# Patient Record
Sex: Female | Born: 1975 | Race: White | Hispanic: No | Marital: Married | Smoking: Never smoker
Health system: Southern US, Community
[De-identification: ages and names within clinical notes are randomized; demographics above are authoritative.]

## PROBLEM LIST (undated history)

## (undated) DIAGNOSIS — F419 Anxiety disorder, unspecified: Secondary | ICD-10-CM

## (undated) DIAGNOSIS — Z8619 Personal history of other infectious and parasitic diseases: Secondary | ICD-10-CM

## (undated) DIAGNOSIS — T7840XA Allergy, unspecified, initial encounter: Secondary | ICD-10-CM

## (undated) DIAGNOSIS — B2790 Infectious mononucleosis, unspecified without complication: Secondary | ICD-10-CM

## (undated) DIAGNOSIS — F509 Eating disorder, unspecified: Secondary | ICD-10-CM

## (undated) HISTORY — DX: Eating disorder, unspecified: F50.9

## (undated) HISTORY — DX: Infectious mononucleosis, unspecified without complication: B27.90

## (undated) HISTORY — DX: Personal history of other infectious and parasitic diseases: Z86.19

## (undated) HISTORY — DX: Anxiety disorder, unspecified: F41.9

## (undated) HISTORY — DX: Allergy, unspecified, initial encounter: T78.40XA

---

## 2007-06-27 DIAGNOSIS — B2790 Infectious mononucleosis, unspecified without complication: Secondary | ICD-10-CM

## 2007-06-27 HISTORY — DX: Infectious mononucleosis, unspecified without complication: B27.90

## 2015-12-26 LAB — HM PAP SMEAR: HM PAP: NORMAL

## 2016-05-15 LAB — HM MAMMOGRAPHY: HM Mammogram: NORMAL

## 2017-08-03 ENCOUNTER — Ambulatory Visit (INDEPENDENT_AMBULATORY_CARE_PROVIDER_SITE_OTHER): Payer: BC Managed Care – PPO | Admitting: Orthopaedic Surgery

## 2017-08-03 ENCOUNTER — Encounter (INDEPENDENT_AMBULATORY_CARE_PROVIDER_SITE_OTHER): Payer: Self-pay | Admitting: Orthopaedic Surgery

## 2017-08-03 ENCOUNTER — Ambulatory Visit (INDEPENDENT_AMBULATORY_CARE_PROVIDER_SITE_OTHER): Payer: Self-pay

## 2017-08-03 DIAGNOSIS — M25552 Pain in left hip: Secondary | ICD-10-CM | POA: Diagnosis not present

## 2017-08-03 DIAGNOSIS — M25551 Pain in right hip: Secondary | ICD-10-CM | POA: Insufficient documentation

## 2017-08-03 NOTE — Progress Notes (Signed)
Office Visit Note   Patient: Diana Perez           Date of Birth: 12-26-1975           MRN: [id number] Visit Date: 08/03/2017              Requested by: No referring provider defined for this encounter. PCP: Donato SchultzLowne Chase, Yvonne R, DO   Assessment & Plan: Visit Diagnoses:  1. Pain of both hip joints   2. 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  At this point I have proposed getting an MRI arthrogram of the right hip to assess the labrum.  There is a question of being able to afford this.  If she is unable to afford this we may go straight to a diagnostic therapeutic intra-articular cortisone injection under fluoroscopy by Dr. Alvester [facility]inNewton.  We will first try getting the MRI arthrogram.  We will wait to hear from the patient if she is not able to proceed with the scan.  Follow-Up Instructions: Return for after mri arthrogram.   Orders:  Orders Placed This Encounter  Procedures  . XR HIPS BILAT W OR W/O PELVIS 2V  . MR Hip Right w/ contrast  . DL FLUORO GUIDED NEEDLE PLC ASPIRATION / INJECTTION/LOC   No orders of the defined types were placed in this encounter.     Procedures: No procedures performed   Clinical Data: No additional findings.   Subjective: Chief Complaint  Patient presents with  . Right Hip - Pain    HPI this is a pleasant 42 year old female who presents to our clinic today with bilateral hip pain right greater than left.  This is been ongoing since 2007 when she was pregnant with her first child.  It started out as intermittent but has become more consistent over the past 4-5 weeks.  All of her pain was located to the groin.  No anterior thigh or lateral hip pain.  She describes as a sharp shooting in nature worse with sleeping as well as turning her hip a certain way.  She is been seen for this back in 2007 where she was sent to outpatient physical therapy as well as given ibuprofen.  Physical therapy did  not improve things and ibuprofen made it worse.  No radicular symptoms noted.  No previous MRI either hip.  Review of Systems as detailed in HPI.  All others reviewed and are negative.   Objective: Vital Signs: There were no vitals taken for this visit.  Physical Exam well-developed well-nourished female in no acute distress.  Alert and oriented x3.  Ortho Exam examination of both hips reveals negative logroll.  Negative straight leg raise.  Positive Faber positive Stinchfield.  Specialty Comments:  No specialty comments available.  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  No fracture.    PMFS History: Patient Active Problem List   Diagnosis Date Noted  . Pain of both hip joints 08/03/2017   Past Medical History:  Diagnosis Date  . Mononucleosis 06/2007    History reviewed. No pertinent family history.  Past Surgical History:  Procedure Laterality Date  . CESAREAN SECTION  05/08/2008  . CESAREAN SECTION  02/04/2006   Social History   Occupational History  . Not on file  Tobacco Use  . Smoking status: Never Smoker  . Smokeless tobacco:  Never Used  Substance and Sexual Activity  . Alcohol use: Not on file  . Drug use: Not on file  . Sexual activity: Not on file

## 2017-08-05 ENCOUNTER — Telehealth (INDEPENDENT_AMBULATORY_CARE_PROVIDER_SITE_OTHER): Payer: Self-pay | Admitting: Orthopaedic Surgery

## 2017-08-05 DIAGNOSIS — M25551 Pain in right hip: Secondary | ICD-10-CM

## 2017-08-05 DIAGNOSIS — M25552 Pain in left hip: Principal | ICD-10-CM

## 2017-08-05 NOTE — Telephone Encounter (Signed)
Yes please.  Let's do the injection first

## 2017-08-05 NOTE — Telephone Encounter (Signed)
Patient was told to get an MRI but with the cost being $1000 out of pocket she said another route Dr. Roda ShuttersXu said was to be a diagnostic cortisone injection. Is this to be scheduled with him or Dr. Alvester MorinNewton? Please advise patient what to do [phone]

## 2017-08-05 NOTE — Telephone Encounter (Signed)
See message below °

## 2017-08-06 NOTE — Telephone Encounter (Signed)
[facility] bilateral hips, R > L, [facility].  I closed referral for MRI at this time. IC pt and LMVM advised.

## 2017-08-12 ENCOUNTER — Ambulatory Visit (INDEPENDENT_AMBULATORY_CARE_PROVIDER_SITE_OTHER): Payer: BC Managed Care – PPO

## 2017-08-12 ENCOUNTER — Encounter (INDEPENDENT_AMBULATORY_CARE_PROVIDER_SITE_OTHER): Payer: Self-pay | Admitting: Physical Medicine and Rehabilitation

## 2017-08-12 ENCOUNTER — Ambulatory Visit (INDEPENDENT_AMBULATORY_CARE_PROVIDER_SITE_OTHER): Payer: BC Managed Care – PPO | Admitting: Physical Medicine and Rehabilitation

## 2017-08-12 DIAGNOSIS — M25551 Pain in right hip: Secondary | ICD-10-CM | POA: Diagnosis not present

## 2017-08-12 DIAGNOSIS — M25552 Pain in left hip: Secondary | ICD-10-CM

## 2017-08-12 NOTE — Progress Notes (Signed)
Diana Perez - 42 y.o. female MRN [id number]  Date of birth: 03-21-1976  Office Visit Note: Visit Date: 08/12/2017 PCP: Donato SchultzLowne Chase, Yvonne R, DO Referred by: Donato SchultzLowne Chase, Yvonne R, *  Subjective: Chief Complaint  Patient presents with  . Right Hip - Pain  . Left Hip - Pain   HPI: Diana Perez is a 42 year old female with chronic hip pain for over 12 years.  She was recently seen by Dr. Roda ShuttersXu who felt like she probably had labral tears and an MRI arthrogram was ordered for both hips.  Unfortunately because of financial situation she could not have that imaging study performed.  They did decide to complete diagnostic and hopefully therapeutic intra-articular hip injections.  She has both right and left hip pain and will do the right side today.    ROS Otherwise per HPI.  Assessment & Plan: Visit Diagnoses:  1. Pain in right hip   2. Pain in left hip     Plan: Findings:  Right intra-articular hip injection with fluoroscopic guidance.  Patient did seem to have relief during the anesthetic phase.  She is going to return for the left side in 2 weeks.    Meds & Orders: No orders of the defined types were placed in this encounter.   Orders Placed This Encounter  Procedures  . Large Joint Inj: R hip joint  . XR C-ARM NO REPORT    Follow-up: Return if symptoms worsen or fail to improve, for Dr. Roda ShuttersXu.   Procedures: Large Joint Inj: R hip joint on 08/12/2017 2:23 PM Indications: diagnostic evaluation and pain Details: 22 G 3.5 in needle, fluoroscopy-guided anterior approach  Arthrogram: No  Medications: 3 mL bupivacaine 0.5 %; 80 mg triamcinolone acetonide 40 MG/ML Outcome: tolerated well, no immediate complications  There was excellent flow of contrast producing a partial arthrogram of the hip. The patient did have relief of symptoms during the anesthetic phase of the injection. Procedure, treatment alternatives, risks and benefits explained, specific risks discussed. Consent  was given by the patient. Immediately prior to procedure a time out was called to verify the correct patient, procedure, equipment, support staff and site/side marked as required. Patient was prepped and draped in the usual sterile fashion.      No notes on file   Clinical History: No specialty comments available.  She reports that  has never smoked. she has never used smokeless tobacco. No results for input(s): HGBA1C, LABURIC in the last 8760 hours.  Objective:  VS:  HT:    WT:   BMI:     BP:   HR: bpm  TEMP: ( )  RESP:  Physical Exam  Musculoskeletal:  Painful range of motion of both hips.  Patient ambulates without aid.    Ortho Exam Imaging: No results found.  Past Medical/Family/Surgical/Social History: Medications & Allergies reviewed per EMR Patient Active Problem List   Diagnosis Date Noted  . Pain of both hip joints 08/03/2017   Past Medical History:  Diagnosis Date  . Mononucleosis 06/2007   History reviewed. No pertinent family history. Past Surgical History:  Procedure Laterality Date  . CESAREAN SECTION  05/08/2008  . CESAREAN SECTION  02/04/2006   Social History   Occupational History  . Not on file  Tobacco Use  . Smoking status: Never Smoker  . Smokeless tobacco: Never Used  Substance and Sexual Activity  . Alcohol use: Not on file  . Drug use: Not on file  . Sexual activity: Not on  file

## 2017-08-12 NOTE — Progress Notes (Deleted)
Pt states pain in both right and left hip, right hip hurts worse than left hip. Pt states pain in both right and left groin. Pt states pain has been going on for 12 years, but last week she stumbled and that made her right hip hurt more. Pt states sleeping on her side for too long makes the pain hurt worse, comfortable mattress eases pain. -Dye Allergies.

## 2017-08-12 NOTE — Patient Instructions (Signed)

## 2017-08-23 MED ORDER — BUPIVACAINE HCL 0.5 % IJ SOLN
3.0000 mL | INTRAMUSCULAR | Status: AC | PRN
  Administered 2017-08-12: 3 mL via INTRA_ARTICULAR

## 2017-08-23 MED ORDER — TRIAMCINOLONE ACETONIDE 40 MG/ML IJ SUSP
80.0000 mg | INTRAMUSCULAR | Status: AC | PRN
  Administered 2017-08-12: 80 mg via INTRA_ARTICULAR

## 2017-08-24 ENCOUNTER — Ambulatory Visit (INDEPENDENT_AMBULATORY_CARE_PROVIDER_SITE_OTHER): Payer: BC Managed Care – PPO

## 2017-08-24 ENCOUNTER — Encounter (INDEPENDENT_AMBULATORY_CARE_PROVIDER_SITE_OTHER): Payer: Self-pay | Admitting: Physical Medicine and Rehabilitation

## 2017-08-24 ENCOUNTER — Ambulatory Visit (INDEPENDENT_AMBULATORY_CARE_PROVIDER_SITE_OTHER): Payer: BC Managed Care – PPO | Admitting: Physical Medicine and Rehabilitation

## 2017-08-24 DIAGNOSIS — M25552 Pain in left hip: Secondary | ICD-10-CM

## 2017-08-24 MED ORDER — TRIAMCINOLONE ACETONIDE 40 MG/ML IJ SUSP
80.0000 mg | INTRAMUSCULAR | Status: AC | PRN
  Administered 2017-08-24: 80 mg via INTRA_ARTICULAR

## 2017-08-24 MED ORDER — BUPIVACAINE HCL 0.5 % IJ SOLN
3.0000 mL | INTRAMUSCULAR | Status: AC | PRN
  Administered 2017-08-24: 3 mL via INTRA_ARTICULAR

## 2017-08-24 NOTE — Progress Notes (Signed)
   Diana Perez - 42 y.o. female MRN [id number]  Date of birth: 05-20-1976  Office Visit Note: Visit Date: 08/24/2017 PCP: Donato SchultzLowne Chase, Yvonne R, DO Referred by: Donato SchultzLowne Chase, Yvonne R, *  Subjective: Chief Complaint  Patient presents with  . Left Hip - Pain   HPI: Diana Perez is a 42 year old female that we saw a couple weeks ago and completed a right intra-articular hip injection for which she has received almost 100% relief and she feels like this is been a life changing event.  She is been dealing with hip pain for over 12 years.  Dr. Roda ShuttersXu was felt like this is probably labral tears.  She could not afford to have diagnostic MRI arthrograms.  Diagnostic intra-articular injection has helped tremendously.  Continues to have left hip pain which we will complete a left intra-articular injection today.  She will follow up with Dr. Roda ShuttersXu.    ROS Otherwise per HPI.  Assessment & Plan: Visit Diagnoses:  1. Pain in left hip     Plan: Findings:  Left intra-articular hip injection with fluoroscopic guidance.  Patient did have relief during the anesthetic phase to a small degree she was not really hurting that much coming in today.    Meds & Orders: No orders of the defined types were placed in this encounter.   Orders Placed This Encounter  Procedures  . Large Joint Inj: L hip joint  . XR C-ARM NO REPORT    Follow-up: Return if symptoms worsen or fail to improve, for Dr. Roda ShuttersXu.   Procedures: Large Joint Inj: L hip joint on 08/24/2017 10:06 AM Indications: diagnostic evaluation and pain Details: 22 G 3.5 in needle, fluoroscopy-guided anterior approach  Arthrogram: No  Medications: 3 mL bupivacaine 0.5 %; 80 mg triamcinolone acetonide 40 MG/ML Outcome: tolerated well, no immediate complications  There was excellent flow of contrast producing a partial arthrogram of the hip. The patient did have mild relief of symptoms during the anesthetic phase of the injection. Procedure, treatment  alternatives, risks and benefits explained, specific risks discussed. Consent was given by the patient. Immediately prior to procedure a time out was called to verify the correct patient, procedure, equipment, support staff and site/side marked as required. Patient was prepped and draped in the usual sterile fashion.      No notes on file   Clinical History: No specialty comments available.  She reports that  has never smoked. she has never used smokeless tobacco. No results for input(s): HGBA1C, LABURIC in the last 8760 hours.  Objective:  VS:  HT:    WT:   BMI:     BP:   HR: bpm  TEMP: ( )  RESP:  Physical Exam  Ortho Exam Imaging: No results found.  Past Medical/Family/Surgical/Social History: Medications & Allergies reviewed per EMR Patient Active Problem List   Diagnosis Date Noted  . Pain of both hip joints 08/03/2017   Past Medical History:  Diagnosis Date  . Mononucleosis 06/2007   History reviewed. No pertinent family history. Past Surgical History:  Procedure Laterality Date  . CESAREAN SECTION  05/08/2008  . CESAREAN SECTION  02/04/2006   Social History   Occupational History  . Not on file  Tobacco Use  . Smoking status: Never Smoker  . Smokeless tobacco: Never Used  Substance and Sexual Activity  . Alcohol use: Not on file  . Drug use: Not on file  . Sexual activity: Not on file

## 2017-08-24 NOTE — Progress Notes (Deleted)
Pt states a pain that comes and goes in left hip. Pt states pain has been going on for about 12 years. Pt states sleeping on it for too long makes pain worse, nothing makes it better. Pt states last injection 08/12/17 on the right hip helped out tremendously. -Dye Allergies.

## 2017-08-24 NOTE — Patient Instructions (Signed)

## 2017-09-08 ENCOUNTER — Ambulatory Visit: Payer: Self-pay | Admitting: Family

## 2017-10-06 ENCOUNTER — Ambulatory Visit: Payer: BC Managed Care – PPO | Admitting: Family

## 2017-10-06 ENCOUNTER — Encounter: Payer: Self-pay | Admitting: Family

## 2017-10-06 VITALS — BP 120/88 | HR 72 | Temp 98.5°F | Resp 16 | Ht 66.0 in | Wt 309.2 lb

## 2017-10-06 DIAGNOSIS — L680 Hirsutism: Secondary | ICD-10-CM | POA: Diagnosis not present

## 2017-10-06 DIAGNOSIS — N926 Irregular menstruation, unspecified: Secondary | ICD-10-CM | POA: Diagnosis not present

## 2017-10-06 DIAGNOSIS — G4700 Insomnia, unspecified: Secondary | ICD-10-CM

## 2017-10-06 DIAGNOSIS — R635 Abnormal weight gain: Secondary | ICD-10-CM | POA: Diagnosis not present

## 2017-10-06 LAB — TESTOSTERONE: Testosterone: 51.09 ng/dL — ABNORMAL HIGH (ref 15.00–40.00)

## 2017-10-06 LAB — HEPATIC FUNCTION PANEL
ALT: 16 U/L (ref 0–35)
AST: 13 U/L (ref 0–37)
Albumin: 4.3 g/dL (ref 3.5–5.2)
Alkaline Phosphatase: 78 U/L (ref 39–117)
BILIRUBIN DIRECT: 0.1 mg/dL (ref 0.0–0.3)
TOTAL PROTEIN: 7 g/dL (ref 6.0–8.3)
Total Bilirubin: 0.4 mg/dL (ref 0.2–1.2)

## 2017-10-06 LAB — BASIC METABOLIC PANEL
BUN: 10 mg/dL (ref 6–23)
CHLORIDE: 105 meq/L (ref 96–112)
CO2: 27 meq/L (ref 19–32)
CREATININE: 0.74 mg/dL (ref 0.40–1.20)
Calcium: 9.8 mg/dL (ref 8.4–10.5)
GFR: 91.76 mL/min (ref >60.00)
Glucose, Bld: 101 mg/dL — ABNORMAL HIGH (ref 70–99)
Potassium: 4.1 mEq/L (ref 3.5–5.1)
Sodium: 139 mEq/L (ref 135–145)

## 2017-10-06 LAB — LUTEINIZING HORMONE: LH: 3.27 m[IU]/mL

## 2017-10-06 LAB — FOLLICLE STIMULATING HORMONE: FSH: 4.8 m[IU]/mL

## 2017-10-06 LAB — TSH: TSH: 1.25 u[IU]/mL (ref 0.35–4.50)

## 2017-10-06 MED ORDER — TRAZODONE HCL 50 MG PO TABS: 25.0000 mg | ORAL_TABLET | Freq: Every evening | ORAL | 3 refills | Status: DC | PRN

## 2017-10-06 NOTE — Patient Instructions (Signed)
Please begin trazodone 1/2 tab at bedtime for sleep. Complete lab work prior to leaving. You will be contacted about your referral to GYN. Work on adding regular exercise and eating a healthy diet with smaller portions.  Welcome to [facility]!

## 2017-10-07 DIAGNOSIS — R635 Abnormal weight gain: Secondary | ICD-10-CM | POA: Insufficient documentation

## 2017-10-07 DIAGNOSIS — G4700 Insomnia, unspecified: Secondary | ICD-10-CM | POA: Insufficient documentation

## 2017-10-07 DIAGNOSIS — N926 Irregular menstruation, unspecified: Secondary | ICD-10-CM | POA: Insufficient documentation

## 2017-10-07 DIAGNOSIS — L680 Hirsutism: Secondary | ICD-10-CM | POA: Insufficient documentation

## 2017-10-07 NOTE — Progress Notes (Signed)
Subjective:    Patient ID: Diana Perez, female    DOB: 04-Jun-1976, 42 y.o.   MRN: [id number]  HPI  HPI  Patient is a 42 yr old female new today to establish care. She has several concerns:  1) Irregular menstrual cycles- reports that she had  mirena placed in in 2015.  Reports that periods were 10 days on 1 week off.  Reports sometimes 2 x a month, "looks like black dust or crumbs."    2) Increased facial hair- x 2 years. Reports "minor" my entire adulthood- worse x 2 years.    3) Insomnia- notes difficulty staying asleep. Averages about 4 hours a night. Can't go back to sleep.  She uses tylenol PM which helps sometimes. Has tried melatonin.  One coffee in AM, occasional diet dr. Reino Kent with lunch.  She does not nap during the day.  Denies snoring.  She reports that she has "night terrors" that wake her from sleep. Reports that she feels "more tired if she gets a full night sleep."    4) Weight gain- has gained 80 pounds in the last 2 years.  Review of Systems  Constitutional: Positive for unexpected weight change.  HENT: Negative for hearing loss and rhinorrhea.   Eyes: Negative for visual disturbance.  Respiratory: Negative for cough.   Cardiovascular: Negative for leg swelling.  Gastrointestinal: Negative for constipation and diarrhea.  Genitourinary: Positive for menstrual problem.  Musculoskeletal: Negative for arthralgias and myalgias.       Had hip injection in January for hip pain, resolved  Skin: Negative for rash.  Neurological: Negative for headaches.  Hematological: Negative for adenopathy.  Psychiatric/Behavioral:       Denies depression/anxiety   Past Medical History:  Diagnosis Date  . Eating disorder   . History of chicken pox   . Mononucleosis 06/2007     Social History   Socioeconomic History  . Marital status: Married    Spouse name: Not on file  . Number of children: Not on file  . Years of education: Not on file  . Highest education  level: Not on file  Social Needs  . Financial resource strain: Not on file  . Food insecurity - worry: Not on file  . Food insecurity - inability: Not on file  . Transportation needs - medical: Not on file  . Transportation needs - non-medical: Not on file  Occupational History  . Not on file  Tobacco Use  . Smoking status: Never Smoker  . Smokeless tobacco: Never Used  Substance and Sexual Activity  . Alcohol use: Yes    Alcohol/week: 3.6 oz    Types: 6 Standard drinks or equivalent per week  . Drug use: Not on file  . Sexual activity: Not on file  Other Topics Concern  . Not on file  Social History Narrative   Publishing copy   In grad school   2 children 2007 son, 2009 son   Married   Enjoys spending time at home, used to love travelling   Movie nights, reading    Past Surgical History:  Procedure Laterality Date  . CESAREAN SECTION  05/08/2008  . CESAREAN SECTION  02/04/2006    Family History  Problem Relation Age of Onset  . Diabetes Mother   . CAD Father        stents  . Osteoarthritis Father        spinal stenosis  . Thyroid cancer Maternal Grandfather        "  multiple types"  . Sudden death Maternal Grandfather        "suicide"  . Heart disease Paternal Grandfather        Early 4950's  . Obesity Sister     No Known Allergies  No current outpatient medications on file prior to visit.   No current facility-administered medications on file prior to visit.     BP 120/88 (BP Location: Right Arm) Comment (Cuff Size): thigh  Pulse 72   Temp 98.5 F (36.9 C) (Oral)   Resp 16   Ht 5\' 6"  (1.676 m)   Wt (!) 309 lb 3.2 oz (140.3 kg)   LMP 10/06/2017   SpO2 100%   BMI 49.91 kg/m       Objective:   Physical Exam  Constitutional: She is oriented to person, place, and time. She appears well-developed and well-nourished.  Obese white female  HENT:  Head: Normocephalic and atraumatic.  Eyes: Conjunctivae are normal.  Neck: Neck supple. No  thyromegaly present.  Cardiovascular: Normal rate, regular rhythm and normal heart sounds.  No murmur heard. Pulmonary/Chest: Effort normal and breath sounds normal. No respiratory distress. She has no wheezes.  Musculoskeletal: She exhibits no edema.  Lymphadenopathy:    She has no cervical adenopathy.  Neurological: She is alert and oriented to person, place, and time.  Skin: Skin is warm and dry.  + facial hair noted  Psychiatric: She has a normal mood and affect. Her behavior is normal. Judgment and thought content normal.          Assessment & Plan:  Insomnia- trial of trazodone.  Hirsutism- suspect PCOS.  Check hormone levels.  Irregular menses- suspect secondary to PCOS and mirena.  Will refer to GYN.  Weight gain- check TSH- discussed healthy diet, exercise and weight loss.

## 2017-10-09 LAB — TESTOSTERONE, FREE: TESTOSTERONE FREE: 3 pg/mL (ref 0.2–5.0)

## 2017-10-09 LAB — SEX HORMONE BINDING GLOBULIN: SEX HORMONE BINDING: 35 nmol/L (ref 17–124)

## 2017-10-09 LAB — ESTRADIOL: ESTRADIOL: 61 pg/mL

## 2017-10-11 ENCOUNTER — Encounter: Payer: Self-pay | Admitting: Family

## 2017-10-11 LAB — TESTOSTERONE, % FREE: TESTOSTERONE-% FREE: 1.4 %

## 2017-10-14 ENCOUNTER — Telehealth: Payer: Self-pay | Admitting: *Deleted

## 2017-10-14 NOTE — Telephone Encounter (Signed)
Received Lab Report results from [facility]; forwarded to provider/SLS 03/21  

## 2017-11-09 ENCOUNTER — Telehealth: Payer: Self-pay | Admitting: Family

## 2017-11-09 NOTE — Telephone Encounter (Signed)
Copied from CRM [phone]. Topic: Quick Communication - See Telephone Encounter >> Nov 09, 2017 10:36 AM Ninfa MeekerPoole, Bridgett H wrote: CRM for notification. See Telephone encounter for: 11/09/17.  Provider unavailable at the time of pt appt. Pt said she will call us back to reschedule.

## 2017-11-09 NOTE — Telephone Encounter (Signed)
Copied from CRM #[id number]. Topic: Quick Communication - See Telephone Encounter °>> Nov 09, 2017 10:36 AM Poole, Bridgett H wrote: °CRM for notification. See Telephone encounter for: 11/09/17. ° °Provider unavailable at the time of pt appt. Pt said she will call us back to reschedule. °

## 2017-11-23 ENCOUNTER — Ambulatory Visit: Payer: BC Managed Care – PPO | Admitting: Family

## 2017-12-13 ENCOUNTER — Encounter: Payer: Self-pay | Admitting: Obstetrics & Gynecology

## 2017-12-13 ENCOUNTER — Ambulatory Visit: Payer: BC Managed Care – PPO | Admitting: Obstetrics & Gynecology

## 2017-12-13 VITALS — BP 132/82 | HR 98 | Ht 66.0 in | Wt 311.0 lb

## 2017-12-13 DIAGNOSIS — N939 Abnormal uterine and vaginal bleeding, unspecified: Secondary | ICD-10-CM | POA: Diagnosis not present

## 2017-12-13 DIAGNOSIS — Z30432 Encounter for removal of intrauterine contraceptive device: Secondary | ICD-10-CM | POA: Diagnosis not present

## 2017-12-13 DIAGNOSIS — Z1239 Encounter for other screening for malignant neoplasm of breast: Secondary | ICD-10-CM

## 2017-12-13 NOTE — Patient Instructions (Signed)
Vasectomy handout given to pt

## 2017-12-13 NOTE — Progress Notes (Signed)
Subjective:     Diana Perez is a 42 y.o. female here for a routine exam. G2P2 LNMP 4 years prev.  Pt reports that prior to the IUD she didn't have reg cycles. Pt does not plan to conceive   Current complaints: .  Pt has a LnIUD that has been present for 4 years. She reports irreg menses with the IUD. She now reports completely irreg cycles with the IUD.  She reports lots of hair growth on her face and neck.   Pt reports that her strings are not visible.        Gynecologic History No LMP recorded. Contraception: IUD Last Pap: 1-2 years prev  Last mammogram: 2017. Results were: normal  Obstetric History OB History  Gravida Para Term Preterm AB Living  2            SAB TAB Ectopic Multiple Live Births          2    # Outcome Date GA Lbr Len/2nd Weight Sex Delivery Anes PTL Lv  2 Gravida      CS-Unspec     1 Gravida      CS-Unspec        The following portions of the patient's history were reviewed and updated as appropriate: allergies, current medications, past family history, past medical history, past social history, past surgical history and problem list.  Review of Systems Pertinent items are noted in HPI.    Objective:  BP 132/82   Pulse 98   Ht  (1.676 m)   Wt (!) 311 lb (141.1 kg)   BMI 50.20 kg/m  Pt in NAD   CONSTITUTIONAL: Well-developed, well-nourished female in no acute distress.  HENT:  Normocephalic, atraumatic EYES: Conjunctivae and EOM are normal. No scleral icterus.  NECK: Normal range of motion SKIN: Skin is warm and dry. No rash noted. Not diaphoretic.No pallor. NEUROLGIC: Alert and oriented to person, place, and time. Normal coordination.   Patient was in the dorsal lithotomy position, normal external genitalia was noted.  A speculum was placed in the patient's vagina, normal discharge was noted, no lesions. The multiparous cervix was visualized, no lesions, no abnormal discharge;  and the cervix was swabbed with Betadine using scopettes. The  strings of the IUD were not visualized, so Kelly forceps were introduced into the endometrial cavity and the IUD was grasped and removed in its entirety.  Patient tolerated the procedure well.      Assessment:   AUB- pt thinks its related to the LnIUD wants it removed  Breast cancer screening  Plan:   Removal of LnIUD Screening PAP in 1 1/2 years.  F/u 3 months or sooner prn Need records from prev GYN Info give to pt on vasectomy Reviewed need for contraception immediately . Pt plans to use condoms until she can convince her spouse to get a vasectomy   Shanta Hartner L. Harraway-Smith, M.D., [facility]

## 2017-12-13 NOTE — Progress Notes (Signed)
Patient has spotting irregularly. Patient states it is so unpredictable there is absolutely no pattern to it. Armandina Stammer RN

## 2017-12-18 ENCOUNTER — Ambulatory Visit (HOSPITAL_BASED_OUTPATIENT_CLINIC_OR_DEPARTMENT_OTHER)
Admission: RE | Admit: 2017-12-18 | Discharge: 2017-12-18 | Disposition: A | Discharge status: Home / Self Care | Payer: BC Managed Care – PPO | Source: Ambulatory Visit | Attending: Obstetrics & Gynecology | Admitting: Obstetrics & Gynecology

## 2017-12-18 DIAGNOSIS — Z1231 Encounter for screening mammogram for malignant neoplasm of breast: Secondary | ICD-10-CM | POA: Insufficient documentation

## 2017-12-18 DIAGNOSIS — Z1239 Encounter for other screening for malignant neoplasm of breast: Secondary | ICD-10-CM

## 2017-12-27 ENCOUNTER — Ambulatory Visit: Payer: BC Managed Care – PPO | Admitting: Family

## 2017-12-27 ENCOUNTER — Encounter: Payer: Self-pay | Admitting: Family

## 2017-12-27 VITALS — BP 116/67 | HR 85 | Temp 98.5°F | Resp 20 | Ht 66.0 in | Wt 313.0 lb

## 2017-12-27 DIAGNOSIS — L304 Erythema intertrigo: Secondary | ICD-10-CM

## 2017-12-27 DIAGNOSIS — Z0001 Encounter for general adult medical examination with abnormal findings: Secondary | ICD-10-CM

## 2017-12-27 DIAGNOSIS — R350 Frequency of micturition: Secondary | ICD-10-CM

## 2017-12-27 DIAGNOSIS — G4700 Insomnia, unspecified: Secondary | ICD-10-CM | POA: Diagnosis not present

## 2017-12-27 DIAGNOSIS — Z0000 Encounter for general adult medical examination without abnormal findings: Secondary | ICD-10-CM

## 2017-12-27 LAB — URINALYSIS, ROUTINE W REFLEX MICROSCOPIC
Bilirubin Urine: NEGATIVE
Hgb urine dipstick: NEGATIVE
KETONES UR: NEGATIVE
LEUKOCYTES UA: NEGATIVE
NITRITE: NEGATIVE
Specific Gravity, Urine: 1.025 (ref 1.000–1.030)
Total Protein, Urine: NEGATIVE
URINE GLUCOSE: NEGATIVE
Urobilinogen, UA: 0.2 (ref 0.0–1.0)
pH: 6 (ref 5.0–8.0)

## 2017-12-27 MED ORDER — TRAZODONE HCL 50 MG PO TABS: 25.0000 mg | ORAL_TABLET | Freq: Every evening | ORAL | 11 refills | Status: DC | PRN

## 2017-12-27 MED ORDER — NYSTATIN 100000 UNIT/GM EX POWD: Freq: Two times a day (BID) | CUTANEOUS | 1 refills | Status: DC

## 2017-12-27 MED FILL — NYSTATIN 100,000 UNIT/GM PO: 100000 | 30 days supply | Qty: 60 | Fill #0

## 2017-12-27 NOTE — Progress Notes (Addendum)
Subjective:    Patient ID: Diana Perez, female Diana Perez   DOB: 09/25/1975, 42 y.o.   MRN: [id number]  HPI  Patient presents today for complete physical.  Immunizations: tetanus 2011 Diet: drinking less alcohol, more fruits/veggies Exercise: not exercising currently, working on her masters degree, will finish in July.  Pap Smear: 12/26/15 Mammogram: 12/18/17 Vision:  Up to date Dental: 2 yrs ago  Wt Readings from Last 3 Encounters:  12/27/17 (!) 313 lb (142 kg)  12/13/17 (!) 311 lb (141.1 kg)  10/06/17 (!) 309 lb 3.2 oz (140.3 kg)   Insomnia- reports that she is sleeping consistently 6-7 hours each night.   Review of Systems  Constitutional: Negative for unexpected weight change.  HENT: Negative for rhinorrhea.   Respiratory: Negative for shortness of breath.        Reports dry cough, ? Seasonal allergies. Comes and goes  Cardiovascular: Negative for chest pain and leg swelling.  Gastrointestinal: Negative for constipation and diarrhea.  Genitourinary: Positive for frequency. Negative for dysuria and hematuria.  Musculoskeletal: Negative for arthralgias and myalgias.  Skin: Negative for rash (denies rash).  Neurological: Negative for headaches.  Hematological: Negative for adenopathy.  Psychiatric/Behavioral:       Denies depression.  Mild situational anxiety       Past Medical History:  Diagnosis Date  . Eating disorder   . History of chicken pox   . Mononucleosis 06/2007     Social History   Socioeconomic History  . Marital status: Married    Spouse name: Not on file  . Number of children: Not on file  . Years of education: Not on file  . Highest education level: Not on file  Occupational History  . Not on file  Social Needs  . Financial resource strain: Not on file  . Food insecurity:    Worry: Not on file    Inability: Not on file  . Transportation needs:    Medical: Not on file    Non-medical: Not on file  Tobacco Use  . Smoking status: Never Smoker   . Smokeless tobacco: Never Used  Substance and Sexual Activity  . Alcohol use: Yes    Alcohol/week: 1.2 - 1.8 oz    Types: 2 - 3 Standard drinks or equivalent per week  . Drug use: Not on file  . Sexual activity: Yes  Lifestyle  . Physical activity:    Days per week: Not on file    Minutes per session: Not on file  . Stress: Not on file  Relationships  . Social connections:    Talks on phone: Not on file    Gets together: Not on file    Attends religious service: Not on file    Active member of club or organization: Not on file    Attends meetings of clubs or organizations: Not on file    Relationship status: Not on file  . Intimate partner violence:    Fear of current or ex partner: Not on file    Emotionally abused: Not on file    Physically abused: Not on file    Forced sexual activity: Not on file  Other Topics Concern  . Not on file  Social History Narrative   Publishing copynstructional designer   In grad school   2 children 2007 son, 2009 son   Married   Enjoys spending time at home, used to love travelling   Movie nights, reading    Past Surgical History:  Procedure Laterality Date  .  CESAREAN SECTION  05/08/2008  . CESAREAN SECTION  02/04/2006    Family History  Problem Relation Age of Onset  . Diabetes Mother   . CAD Father        stents  . Osteoarthritis Father        spinal stenosis  . Thyroid cancer Maternal Grandfather        "multiple types"  . Sudden death Maternal Grandfather        "suicide"  . Heart disease Paternal Grandfather        Early 56's  . Obesity Sister     No Known Allergies  Current Outpatient Medications on File Prior to Visit  Medication Sig Dispense Refill  . traZODone (DESYREL) 50 MG tablet Take 0.5-1 tablets (25-50 mg total) by mouth at bedtime as needed for sleep. (Patient taking differently: Take 50 mg by mouth at bedtime as needed for sleep. ) 30 tablet 3   No current facility-administered medications on file prior to visit.       BP 116/67 (BP Location: Left Arm, Cuff Size: Large)   Pulse 85   Temp 98.5 F (36.9 C) (Oral)   Resp 20   Ht 5\' 6"  (1.676 m)   Wt (!) 313 lb (142 kg)   LMP 12/18/2017   SpO2 100%   BMI 50.52 kg/m    Objective:   Physical Exam  Physical Exam  Constitutional: She is oriented to person, place, and time. She appears well-developed and well-nourished. No distress.  HENT:  Head: Normocephalic and atraumatic.  Right Ear: Tympanic membrane and ear canal normal.  Left Ear: Tympanic membrane and ear canal normal.  Mouth/Throat: Oropharynx is clear and moist.  Eyes: Pupils are equal, round, and reactive to light. No scleral icterus.  Neck: Normal range of motion. No thyromegaly present.  Cardiovascular: Normal rate and regular rhythm.   No murmur heard. Pulmonary/Chest: Effort normal and breath sounds normal. No respiratory distress. He has no wheezes. She has no rales. She exhibits no tenderness.  Abdominal: Soft. Bowel sounds are normal. She exhibits no distension and no mass. There is no tenderness. There is no rebound and no guarding.  Musculoskeletal: She exhibits no edema.  Lymphadenopathy:    She has no cervical adenopathy.  Neurological: She is alert and oriented to person, place, and time. She has normal patellar reflexes. She exhibits normal muscle tone. Coordination normal.  Skin: Skin is warm and dry. Mild fungal rash noted beneath breasts Psychiatric: She has a normal mood and affect. Her behavior is normal. Judgment and thought content normal.  Breast/pelvic:  Deferred to GYN           Assessment & Plan:         Assessment & Plan:  Preventative care- discussed healthy diet, exercise and weight loss. Tracing is personally reviewed.  EKG notes NSR.  No acute changes.  Mammo/pap up to date.  She will return fasting for lab work.   Urinary frequency- check UA and culture.  OAB is a consideration, but she does not wish to start daily med at this  time.  Insomnia- stable/improved with trazodone. Continue same.   Intertrigo- trial of nystatin powder.

## 2017-12-27 NOTE — Patient Instructions (Addendum)
Please schedule routine dental exam.   Please compete lab work prior to leaving. Apply nystatin powder twice daily to affected areas.

## 2017-12-28 LAB — URINE CULTURE
MICRO NUMBER:: 90664568
SPECIMEN QUALITY:: ADEQUATE

## 2018-01-07 ENCOUNTER — Other Ambulatory Visit (INDEPENDENT_AMBULATORY_CARE_PROVIDER_SITE_OTHER): Payer: BC Managed Care – PPO

## 2018-01-07 ENCOUNTER — Encounter: Payer: Self-pay | Admitting: Family

## 2018-01-07 DIAGNOSIS — Z0000 Encounter for general adult medical examination without abnormal findings: Secondary | ICD-10-CM

## 2018-01-07 LAB — HEPATIC FUNCTION PANEL
ALBUMIN: 4.1 g/dL (ref 3.5–5.2)
ALT: 17 U/L (ref 0–35)
AST: 14 U/L (ref 0–37)
Alkaline Phosphatase: 77 U/L (ref 39–117)
BILIRUBIN DIRECT: 0.1 mg/dL (ref 0.0–0.3)
TOTAL PROTEIN: 6.4 g/dL (ref 6.0–8.3)
Total Bilirubin: 0.6 mg/dL (ref 0.2–1.2)

## 2018-01-07 LAB — BASIC METABOLIC PANEL
BUN: 10 mg/dL (ref 6–23)
CALCIUM: 9.3 mg/dL (ref 8.4–10.5)
CO2: 26 mEq/L (ref 19–32)
Chloride: 105 mEq/L (ref 96–112)
Creatinine, Ser: 0.77 mg/dL (ref 0.40–1.20)
GFR: 87.53 mL/min (ref >60.00)
Glucose, Bld: 96 mg/dL (ref 70–99)
POTASSIUM: 4.2 meq/L (ref 3.5–5.1)
SODIUM: 140 meq/L (ref 135–145)

## 2018-01-07 LAB — CBC WITH DIFFERENTIAL/PLATELET
BASOS ABS: 0 10*3/uL (ref 0.0–0.1)
Basophils Relative: 0.4 % (ref 0.0–3.0)
EOS PCT: 2.6 % (ref 0.0–5.0)
Eosinophils Absolute: 0.2 10*3/uL (ref 0.0–0.7)
HCT: 42.9 % (ref 36.0–46.0)
HEMOGLOBIN: 14.5 g/dL (ref 12.0–15.0)
LYMPHS ABS: 1.8 10*3/uL (ref 0.7–4.0)
Lymphocytes Relative: 26.6 % (ref 12.0–46.0)
MCHC: 33.8 g/dL (ref 30.0–36.0)
MCV: 88.5 fl (ref 78.0–100.0)
MONO ABS: 0.5 10*3/uL (ref 0.1–1.0)
Monocytes Relative: 6.8 % (ref 3.0–12.0)
NEUTROS PCT: 63.6 % (ref 43.0–77.0)
Neutro Abs: 4.2 10*3/uL (ref 1.4–7.7)
Platelets: 219 10*3/uL (ref 150.0–400.0)
RBC: 4.85 Mil/uL (ref 3.87–5.11)
RDW: 13.1 % (ref 11.5–15.5)
WBC: 6.7 10*3/uL (ref 4.0–10.5)

## 2018-01-07 LAB — LIPID PANEL
CHOL/HDL RATIO: 4
Cholesterol: 196 mg/dL (ref 0–200)
HDL: 44.2 mg/dL (ref >39.00)
LDL CALC: 114 mg/dL — AB (ref 0–99)
NONHDL: 152
TRIGLYCERIDES: 189 mg/dL — AB (ref 0.0–149.0)
VLDL: 37.8 mg/dL (ref 0.0–40.0)

## 2018-01-07 LAB — TSH: TSH: 1.58 u[IU]/mL (ref 0.35–4.50)

## 2018-01-26 MED FILL — traZODone HCL 50 MG TABS: 50 | 30 days supply | Qty: 30 | Fill #0

## 2018-03-02 MED FILL — traZODone HCL 50 MG TABS: 50 | 30 days supply | Qty: 30 | Fill #1

## 2018-03-31 MED FILL — traZODone HCL 50 MG TABS: 50 | 30 days supply | Qty: 30 | Fill #2

## 2018-05-03 MED FILL — traZODone HCL 50 MG TABS: 50 | 30 days supply | Qty: 30 | Fill #3

## 2018-06-01 MED FILL — traZODone HCL 50 MG TABS: 50 | 30 days supply | Qty: 30 | Fill #4

## 2018-07-08 MED FILL — traZODone HCL 50 MG TABS: 50 | 30 days supply | Qty: 30 | Fill #5

## 2018-08-10 MED FILL — traZODone HCL 50 MG TABS: 50 | 30 days supply | Qty: 30 | Fill #6

## 2018-08-30 ENCOUNTER — Telehealth (INDEPENDENT_AMBULATORY_CARE_PROVIDER_SITE_OTHER): Payer: Self-pay | Admitting: Orthopaedic Surgery

## 2018-08-30 NOTE — Telephone Encounter (Signed)
Returned call to patient left message for a return call °

## 2018-09-16 MED FILL — traZODone HCL 50 MG TABS: 50 | 30 days supply | Qty: 30 | Fill #7

## 2018-09-27 ENCOUNTER — Ambulatory Visit (INDEPENDENT_AMBULATORY_CARE_PROVIDER_SITE_OTHER): Payer: BC Managed Care – PPO | Admitting: Orthopaedic Surgery

## 2018-09-30 ENCOUNTER — Encounter: Payer: Self-pay | Admitting: Family Medicine

## 2018-09-30 ENCOUNTER — Other Ambulatory Visit: Payer: Self-pay

## 2018-09-30 ENCOUNTER — Ambulatory Visit: Payer: BC Managed Care – PPO | Admitting: Family Medicine

## 2018-09-30 VITALS — BP 118/84 | HR 87 | Temp 98.6°F | Resp 18 | Ht 65.0 in | Wt 311.2 lb

## 2018-09-30 DIAGNOSIS — R6889 Other general symptoms and signs: Secondary | ICD-10-CM

## 2018-09-30 MED ORDER — BENZONATATE 100 MG PO CAPS: 100.0000 mg | ORAL_CAPSULE | Freq: Three times a day (TID) | ORAL | 0 refills | Status: DC | PRN

## 2018-09-30 MED FILL — BENZONATATE 100 MG CAP: 100 | 10 days supply | Qty: 30 | Fill #0

## 2018-09-30 NOTE — Patient Instructions (Signed)
Continue to push fluids, practice good hand hygiene, and cover your mouth if you cough.  If you start having fevers, shaking or shortness of breath, seek immediate care.  Ibuprofen 400-600 mg (2-3 over the counter strength tabs) every 6 hours as needed for pain.  OK to take Tylenol 1000 mg (2 extra strength tabs) or 975 mg (3 regular strength tabs) every 6 hours as needed.  For symptoms, consider using Vick's VapoRub on chest or under nose, air humidifier, Benadryl at night, and elevating the head of the bed. Tylenol and ibuprofen for aches and pains you may be experiencing.   Let us know if you need anything.  

## 2018-09-30 NOTE — Progress Notes (Signed)
Chief Complaint  Patient presents with  . Flu like symptoms    Pt states symptoms started Monday with fever. Pt states last fever was Wed night. Pt states taking OTC ibuprofen, tylenol, and     Diana Perez here for URI complaints.  Duration: 4 days  Associated symptoms: Fever (102 F 2 days ago), sinus congestion, sore throat, wheezing, myalgia and cough Denies: sinus pain, rhinorrhea, itchy watery eyes, ear pain, ear drainage, sore throat and shortness of breath Treatment to date: Tylenol, ibuprofen, Alka-selzter Sick contacts: Yes- son had same s/s's and exposed to friend dx'd w flu  ROS:  Const: Denies current fevers HEENT: As noted in HPI Lungs: No SOB  Past Medical History:  Diagnosis Date  . Eating disorder   . History of chicken pox   . Mononucleosis 06/2007    BP 118/84 (BP Location: Left Arm, Patient Position: Sitting, Cuff Size: Large)   Pulse 87   Temp 98.6 F (37 C) (Oral)   Resp 18   Ht 5\' 5"  (1.651 m)   Wt (!) 311 lb 3.2 oz (141.2 kg)   SpO2 99%   BMI 51.79 kg/m  General: Awake, alert, appears stated age HEENT: AT, [place], ears patent b/l and TM's neg, nares patent w/o discharge, pharynx pink and without exudates, MMM Neck: No masses or asymmetry Heart: RRR Lungs: CTAB, no accessory muscle use Psych: Age appropriate judgment and insight, normal mood and affect  Flu-like symptoms - Plan: benzonatate (TESSALON) 100 MG capsule  She is outside the window for Tamiflu.  Continue ibuprofen and Tylenol.  Letter for work given. Continue to push fluids, practice good hand hygiene, cover mouth when coughing. F/u prn. If starting to experience fevers, shaking, or shortness of breath, seek immediate care. Pt voiced understanding and agreement to the plan.  Jilda Roche [place], DO 09/30/18 12:02 PM

## 2018-10-18 MED FILL — traZODone HCL 50 MG TABS: 50 | 30 days supply | Qty: 30 | Fill #8

## 2018-11-09 ENCOUNTER — Encounter: Payer: Self-pay | Admitting: Family

## 2018-11-09 MED ORDER — TRAZODONE HCL 50 MG PO TABS: 25.0000 mg | ORAL_TABLET | Freq: Every evening | ORAL | 3 refills | Status: DC | PRN

## 2018-11-21 MED FILL — traZODone HCL 50 MG TABS: 50 | 30 days supply | Qty: 30 | Fill #9

## 2018-12-23 MED FILL — traZODone HCL 50 MG TABS: 50 | 30 days supply | Qty: 30 | Fill #10

## 2019-01-16 ENCOUNTER — Encounter: Payer: BC Managed Care – PPO | Admitting: Family

## 2019-01-19 MED FILL — traZODone HCL 50 MG TABS: 50 | 30 days supply | Qty: 30 | Fill #0

## 2019-02-06 ENCOUNTER — Encounter: Payer: Self-pay | Admitting: Obstetrics & Gynecology

## 2019-02-06 ENCOUNTER — Ambulatory Visit (INDEPENDENT_AMBULATORY_CARE_PROVIDER_SITE_OTHER): Payer: BC Managed Care – PPO | Admitting: Obstetrics & Gynecology

## 2019-02-06 ENCOUNTER — Other Ambulatory Visit: Payer: Self-pay

## 2019-02-06 VITALS — BP 127/79 | HR 99 | Ht 66.0 in | Wt 321.0 lb

## 2019-02-06 DIAGNOSIS — Z01419 Encounter for gynecological examination (general) (routine) without abnormal findings: Secondary | ICD-10-CM | POA: Diagnosis not present

## 2019-02-06 DIAGNOSIS — F5101 Primary insomnia: Secondary | ICD-10-CM

## 2019-02-06 DIAGNOSIS — Z124 Encounter for screening for malignant neoplasm of cervix: Secondary | ICD-10-CM

## 2019-02-06 DIAGNOSIS — Z1151 Encounter for screening for human papillomavirus (HPV): Secondary | ICD-10-CM

## 2019-02-06 DIAGNOSIS — Z1239 Encounter for other screening for malignant neoplasm of breast: Secondary | ICD-10-CM

## 2019-02-06 MED ORDER — TRAZODONE HCL 100 MG PO TABS: 100.0000 mg | ORAL_TABLET | Freq: Every evening | ORAL | 3 refills | Status: DC | PRN

## 2019-02-06 MED FILL — traZODone HCL 100 MG TABS: 100 | 30 days supply | Qty: 30 | Fill #0

## 2019-02-06 NOTE — Progress Notes (Signed)
Subjective:     Diana Perez is a 43 y.o. female here for a routine exam.  Current complaints: IUD removed 12/13/2017.  Pt is sexually active. Monogamous relationship. Condom use due to husbands h/o prostatitis. Bleeding is back to normal    Gynecologic History Patient's last menstrual period was 01/23/2019 (within days). Contraception:  Last Pap: >3 years prev at [facility] Results were: normal Last mammogram: 12/18/2017. Results were: normal  Obstetric History OB History  Gravida Para Term Preterm AB Living  2 1 1     2   SAB TAB Ectopic Multiple Live Births          2    # Outcome Date GA Lbr Len/2nd Weight Sex Delivery Anes PTL Lv  2 Term 02/04/06    M CS-Unspec     1 Gravida      CS-Unspec       The following portions of the patient's history were reviewed and updated as appropriate: allergies, current medications, past family history, past medical history, past social history, past surgical history and problem list.  Review of Systems Pertinent items are noted in HPI.    Objective:  BP 127/79   Pulse 99   Ht 5\' 6"  (1.676 m)   Wt (!) 321 lb (145.6 kg)   LMP 01/23/2019 (Within Days)   BMI 51.81 kg/m   General Appearance:    Alert, cooperative, no distress, appears stated age  Head:    Normocephalic, without obvious abnormality, atraumatic  Eyes:    conjunctiva/corneas clear, EOM's intact, both eyes  Ears:    Normal external ear canals, both ears  Nose:   Nares normal, septum midline, mucosa normal, no drainage    or sinus tenderness  Throat:   Lips, mucosa, and tongue normal; teeth and gums normal  Neck:   Supple, symmetrical, trachea midline, no adenopathy;    thyroid:  no enlargement/tenderness/nodules  Back:     Symmetric, no curvature, ROM normal, no CVA tenderness  Lungs:     respirations unlabored  Chest Wall:    No tenderness or deformity   Heart:    Regular rate and rhythm, S1 and S2 normal, no murmur, rub   or gallop  Breast Exam:    No tenderness,  masses, or nipple abnormality  Abdomen:     Soft, non-tender, bowel sounds active all four quadrants,    no masses, no organomegaly  Genitalia:    Normal female without lesion, discharge or tenderness     Extremities:   Extremities normal, atraumatic, no cyanosis or edema  Pulses:   2+ and symmetric all extremities  Skin:   Skin color, texture, turgor normal, no rashes or lesions     Assessment:    Healthy female exam.   Insomnia- will increase the dosage of Trazadone.      Plan:    Follow up in: 1 year.    Pt will f/u via telephone or MyChart in 3-4 weeks to review update on sleep trazodone increase to 100mg  qhs Reviewed sleep hygiene  Screening mammogram   Itzamar Traynor L. Harraway-Smith, M.D., Cherlynn June

## 2019-02-06 NOTE — Progress Notes (Signed)
Patient presents for annual exam

## 2019-02-08 LAB — CYTOLOGY - PAP
Diagnosis: NEGATIVE
HPV: NOT DETECTED

## 2019-02-15 ENCOUNTER — Other Ambulatory Visit: Payer: Self-pay

## 2019-02-15 ENCOUNTER — Ambulatory Visit (HOSPITAL_BASED_OUTPATIENT_CLINIC_OR_DEPARTMENT_OTHER)
Admission: RE | Admit: 2019-02-15 | Discharge: 2019-02-15 | Disposition: A | Discharge status: Home / Self Care | Payer: BC Managed Care – PPO | Source: Ambulatory Visit | Attending: Obstetrics & Gynecology | Admitting: Obstetrics & Gynecology

## 2019-02-15 DIAGNOSIS — Z1231 Encounter for screening mammogram for malignant neoplasm of breast: Secondary | ICD-10-CM | POA: Insufficient documentation

## 2019-02-15 DIAGNOSIS — Z1239 Encounter for other screening for malignant neoplasm of breast: Secondary | ICD-10-CM

## 2019-03-07 MED FILL — traZODone HCL 100 MG TABS: 100 | 30 days supply | Qty: 30 | Fill #1

## 2019-04-13 MED FILL — traZODone HCL 100 MG TABS: 100 | 30 days supply | Qty: 30 | Fill #2

## 2019-04-20 ENCOUNTER — Ambulatory Visit (INDEPENDENT_AMBULATORY_CARE_PROVIDER_SITE_OTHER): Payer: BC Managed Care – PPO

## 2019-04-20 ENCOUNTER — Other Ambulatory Visit: Payer: Self-pay

## 2019-04-20 DIAGNOSIS — Z23 Encounter for immunization: Secondary | ICD-10-CM | POA: Diagnosis not present

## 2019-04-20 NOTE — Progress Notes (Signed)
Here for flu shot

## 2019-05-15 MED FILL — traZODone HCL 100 MG TABS: 100 | 30 days supply | Qty: 30 | Fill #3

## 2019-05-19 ENCOUNTER — Other Ambulatory Visit: Payer: Self-pay | Admitting: Obstetrics & Gynecology

## 2019-05-19 DIAGNOSIS — F5101 Primary insomnia: Secondary | ICD-10-CM

## 2019-05-19 MED ORDER — TRAZODONE HCL 100 MG PO TABS: 100.0000 mg | ORAL_TABLET | Freq: Every evening | ORAL | 3 refills | Status: DC | PRN

## 2019-06-20 ENCOUNTER — Other Ambulatory Visit: Payer: Self-pay | Admitting: Obstetrics & Gynecology

## 2019-06-20 DIAGNOSIS — F5101 Primary insomnia: Secondary | ICD-10-CM

## 2019-06-20 MED ORDER — TRAZODONE HCL 100 MG PO TABS: 100.0000 mg | ORAL_TABLET | Freq: Every evening | ORAL | 3 refills | Status: DC | PRN

## 2019-06-20 MED FILL — traZODone HCL 100 MG TABS: 100 | 30 days supply | Qty: 30 | Fill #0

## 2019-07-19 MED FILL — traZODone HCL 100 MG TABS: 100 | 30 days supply | Qty: 30 | Fill #1

## 2019-08-22 MED FILL — traZODone HCL 100 MG TABS: 100 | 30 days supply | Qty: 30 | Fill #2

## 2019-09-20 MED FILL — traZODone HCL 100 MG TABS: 100 | 30 days supply | Qty: 30 | Fill #3

## 2019-09-30 ENCOUNTER — Ambulatory Visit: Payer: BC Managed Care – PPO | Attending: Internal Medicine

## 2019-09-30 DIAGNOSIS — Z23 Encounter for immunization: Secondary | ICD-10-CM | POA: Insufficient documentation

## 2019-10-20 MED FILL — traZODone HCL 100 MG TABS: 100 | 30 days supply | Qty: 30 | Fill #0

## 2019-11-01 ENCOUNTER — Ambulatory Visit: Payer: BC Managed Care – PPO | Attending: Internal Medicine

## 2019-11-01 DIAGNOSIS — Z23 Encounter for immunization: Secondary | ICD-10-CM

## 2019-11-01 NOTE — Progress Notes (Signed)
   Covid-19 Vaccination Clinic  Name:  Cydnie Deason    MRN: [id number] DOB: 1975-12-25  11/01/2019  Ms. Bryk-Lee was observed post Covid-19 immunization for 15 minutes without incident. She was provided with Vaccine Information Sheet and instruction to access the V-Safe system.   Ms. Rosezetta Schlatter was instructed to call 911 with any severe reactions post vaccine: Marland Kitchen Difficulty breathing  . Swelling of face and throat  . A fast heartbeat  . A bad rash all over body  . Dizziness and weakness   Immunizations Administered    Name Date Dose VIS Date Route   Moderna COVID-19 Vaccine 11/01/2019 12:21 PM 0.5 mL 06/27/2019 Intramuscular   Manufacturer: Gala Murdoch   Lot: [id number]   NDC: 38882-800-34

## 2019-11-02 ENCOUNTER — Ambulatory Visit: Payer: BC Managed Care – PPO

## 2019-11-28 MED FILL — traZODone HCL 100 MG TABS: 100 | 30 days supply | Qty: 30 | Fill #1

## 2019-12-28 MED FILL — traZODone HCL 100 MG TABS: 100 | 30 days supply | Qty: 30 | Fill #2

## 2020-01-25 MED FILL — traZODone HCL 100 MG TABS: 100 | 30 days supply | Qty: 30 | Fill #3

## 2020-03-06 ENCOUNTER — Other Ambulatory Visit: Payer: Self-pay | Admitting: Obstetrics & Gynecology

## 2020-03-06 ENCOUNTER — Other Ambulatory Visit: Payer: Self-pay

## 2020-03-06 ENCOUNTER — Ambulatory Visit (INDEPENDENT_AMBULATORY_CARE_PROVIDER_SITE_OTHER): Payer: BC Managed Care – PPO | Admitting: Obstetrics & Gynecology

## 2020-03-06 ENCOUNTER — Other Ambulatory Visit (HOSPITAL_COMMUNITY)
Admission: RE | Admit: 2020-03-06 | Discharge: 2020-03-06 | Disposition: A | Discharge status: Home / Self Care | Payer: BC Managed Care – PPO | Source: Ambulatory Visit | Attending: Obstetrics & Gynecology | Admitting: Obstetrics & Gynecology

## 2020-03-06 ENCOUNTER — Encounter: Payer: Self-pay | Admitting: Obstetrics & Gynecology

## 2020-03-06 VITALS — BP 128/72 | HR 90 | Ht 66.0 in | Wt 305.0 lb

## 2020-03-06 DIAGNOSIS — G4709 Other insomnia: Secondary | ICD-10-CM

## 2020-03-06 DIAGNOSIS — Z1239 Encounter for other screening for malignant neoplasm of breast: Secondary | ICD-10-CM

## 2020-03-06 DIAGNOSIS — F5101 Primary insomnia: Secondary | ICD-10-CM

## 2020-03-06 DIAGNOSIS — Z01419 Encounter for gynecological examination (general) (routine) without abnormal findings: Secondary | ICD-10-CM

## 2020-03-06 MED ORDER — TRAZODONE HCL 100 MG PO TABS: 100.0000 mg | ORAL_TABLET | Freq: Every evening | ORAL | 3 refills | Status: DC | PRN

## 2020-03-06 MED FILL — traZODone HCL 100 MG TABS: 100 | 30 days supply | Qty: 30 | Fill #0

## 2020-03-06 NOTE — Patient Instructions (Signed)

## 2020-03-06 NOTE — Progress Notes (Signed)
Subjective:     Diana Perez is a 44 y.o. female here for a routine exam. G2P2 Current complaints: pt with no GYN problems. She reports that she is working on improving her overall health without focusing on weight loss specifically. She is doing well. Exercising. Eating healthier and starting new activities. Has been canoeing. Her sleep has improved on the trazadone. She ran out last week and has noticed a difference since that time.   Pt works for [employer]. Her job is Nature conservation officer for remote learning. School will be back in session. She is s/p the COVID vaccine.         Gynecologic History No LMP recorded. Contraception: condoms Last Pap: 02/06/2019. Results were: normal Last mammogram: 02/15/2019. Results were: normal  Obstetric History OB History  Gravida Para Term Preterm AB Living  2 1 1     2   SAB TAB Ectopic Multiple Live Births          2    # Outcome Date GA Lbr Len/2nd Weight Sex Delivery Anes PTL Lv  2 Term 02/04/06    M CS-Unspec     1 Gravida      CS-Unspec        The following portions of the patient's history were reviewed and updated as appropriate: allergies, current medications, past family history, past medical history, past social history, past surgical history and problem list.  Review of Systems Pertinent items are noted in HPI.    Objective:  BP 128/72   Pulse 90   Ht 5\' 6"  (1.676 m)   Wt (!) 305 lb (138.3 kg)   LMP 02/26/2020 (Exact Date)   BMI 49.23 kg/m  General Appearance:    Alert, cooperative, no distress, appears stated age  Head:    Normocephalic, without obvious abnormality, atraumatic  Eyes:    conjunctiva/corneas clear, EOM's intact, both eyes  Ears:    Normal external ear canals, both ears  Nose:   Nares normal, septum midline, mucosa normal, no drainage    or sinus tenderness  Throat:   Lips, mucosa, and tongue normal; teeth and gums normal  Neck:   Supple, symmetrical, trachea midline, no adenopathy;    thyroid:  no  enlargement/tenderness/nodules  Back:     Symmetric, no curvature, ROM normal, no CVA tenderness  Lungs:     respirations unlabored  Chest Wall:    No tenderness or deformity   Heart:    Regular rate and rhythm  Breast Exam:    No tenderness, masses, or nipple abnormality  Abdomen:     Soft, non-tender, bowel sounds active all four quadrants,    no masses, no organomegaly  Genitalia:    Normal female without lesion, discharge or tenderness     Extremities:   Extremities normal, atraumatic, no cyanosis or edema  Pulses:   2+ and symmetric all extremities  Skin:   Skin color, texture, turgor normal, no rashes or lesions     Assessment:    Healthy female exam.   Health maintenance- reviewed exercise. She is on a great plan for focusing on overall health!   Plan:     Diagnoses and all orders for this visit:  Well female exam with routine gynecological exam -     Cancel: MM Digital Screening; Future -     Cytology - PAP( [facility]) -     MM 3D SCREEN BREAST BILATERAL; Future  Breast cancer screening other than mammogram -     Cancel:  MM Digital Screening; Future -     Cytology - PAP( [place]) -     MM 3D SCREEN BREAST BILATERAL; Future  Other insomnia  Primary insomnia -     traZODone (DESYREL) 100 MG tablet; Take 1 tablet (100 mg total) by mouth at bedtime as needed for sleep.  f/u in 1 year or sooner prn   Isais Klipfel L. Harraway-Smith, M.D., Evern Core

## 2020-03-11 LAB — CYTOLOGY - PAP
Comment: NEGATIVE
Diagnosis: NEGATIVE
High risk HPV: NEGATIVE

## 2020-03-26 ENCOUNTER — Ambulatory Visit (HOSPITAL_BASED_OUTPATIENT_CLINIC_OR_DEPARTMENT_OTHER)
Admission: RE | Admit: 2020-03-26 | Discharge: 2020-03-26 | Disposition: A | Discharge status: Home / Self Care | Payer: BC Managed Care – PPO | Source: Ambulatory Visit | Attending: Obstetrics & Gynecology | Admitting: Obstetrics & Gynecology

## 2020-03-26 ENCOUNTER — Other Ambulatory Visit: Payer: Self-pay

## 2020-03-26 DIAGNOSIS — Z1239 Encounter for other screening for malignant neoplasm of breast: Secondary | ICD-10-CM

## 2020-03-26 DIAGNOSIS — Z01419 Encounter for gynecological examination (general) (routine) without abnormal findings: Secondary | ICD-10-CM

## 2020-03-26 DIAGNOSIS — Z1231 Encounter for screening mammogram for malignant neoplasm of breast: Secondary | ICD-10-CM | POA: Insufficient documentation

## 2020-04-05 MED FILL — traZODone HCL 100 MG TABS: 100 | 30 days supply | Qty: 30 | Fill #1

## 2020-05-06 MED FILL — traZODone HCL 100 MG TABS: 100 | 30 days supply | Qty: 30 | Fill #2

## 2020-05-16 ENCOUNTER — Other Ambulatory Visit: Payer: Self-pay | Admitting: Orthopedic Surgery

## 2020-05-16 ENCOUNTER — Telehealth: Payer: BC Managed Care – PPO | Admitting: Orthopedic Surgery

## 2020-05-16 DIAGNOSIS — R399 Unspecified symptoms and signs involving the genitourinary system: Secondary | ICD-10-CM | POA: Diagnosis not present

## 2020-05-16 MED ORDER — NITROFURANTOIN MONOHYD MACRO 100 MG PO CAPS: 100.0000 mg | ORAL_CAPSULE | Freq: Two times a day (BID) | ORAL | 0 refills | Status: DC

## 2020-05-16 NOTE — Progress Notes (Signed)

## 2020-05-17 MED FILL — NITROFURANTOIN MONO-MCR 100: 100 | 5 days supply | Qty: 10 | Fill #0

## 2020-06-07 MED FILL — traZODone HCL 100 MG TABS: 100 | 30 days supply | Qty: 30 | Fill #3

## 2020-07-12 ENCOUNTER — Other Ambulatory Visit: Payer: Self-pay | Admitting: Obstetrics & Gynecology

## 2020-07-12 DIAGNOSIS — F5101 Primary insomnia: Secondary | ICD-10-CM

## 2020-07-12 MED ORDER — TRAZODONE HCL 100 MG PO TABS
100.0000 mg | ORAL_TABLET | Freq: Every evening | ORAL | 3 refills | Status: DC | PRN
Start: 2020-07-12 — End: 2020-07-12

## 2020-07-12 MED FILL — traZODone HCL 100 MG TABS: 100 | 30 days supply | Qty: 30 | Fill #0

## 2020-08-09 MED FILL — traZODone HCL 100 MG TABS: 100 | 30 days supply | Qty: 30 | Fill #1

## 2020-09-10 MED FILL — traZODone HCL 100 MG TABS: 100 | 30 days supply | Qty: 30 | Fill #2

## 2020-11-12 ENCOUNTER — Other Ambulatory Visit: Payer: Self-pay | Admitting: Obstetrics & Gynecology

## 2020-11-12 DIAGNOSIS — F5101 Primary insomnia: Secondary | ICD-10-CM

## 2020-11-18 ENCOUNTER — Telehealth: Payer: Self-pay | Admitting: General Practice

## 2020-11-18 ENCOUNTER — Other Ambulatory Visit (HOSPITAL_BASED_OUTPATIENT_CLINIC_OR_DEPARTMENT_OTHER): Payer: Self-pay

## 2020-11-18 NOTE — Telephone Encounter (Signed)
Patient called and left message on nurse voicemail line stating she has a prescription for trazodone from Dr Erin Fulling and she normally sends in 3 months refills at a time. Patient states she submitted the request for the refill several days ago but nothing has been sent in yet. Patient states she is out and hasn't slept in days & would like refill sent in asap.

## 2020-11-21 ENCOUNTER — Other Ambulatory Visit: Payer: Self-pay | Admitting: Obstetrics & Gynecology

## 2020-11-21 ENCOUNTER — Other Ambulatory Visit (HOSPITAL_BASED_OUTPATIENT_CLINIC_OR_DEPARTMENT_OTHER): Payer: Self-pay

## 2020-11-21 DIAGNOSIS — F5101 Primary insomnia: Secondary | ICD-10-CM

## 2020-11-21 MED ORDER — TRAZODONE HCL 100 MG PO TABS
ORAL_TABLET | Freq: Every evening | ORAL | 3 refills | Status: DC | PRN
  Filled 2020-11-21: qty 30, 30d supply, fill #0
  Filled 2020-12-18: qty 30, 30d supply, fill #1
  Filled 2021-01-20: qty 30, 30d supply, fill #2
  Filled 2021-02-19: qty 30, 30d supply, fill #3

## 2020-11-26 ENCOUNTER — Other Ambulatory Visit (HOSPITAL_BASED_OUTPATIENT_CLINIC_OR_DEPARTMENT_OTHER): Payer: Self-pay

## 2020-12-19 ENCOUNTER — Other Ambulatory Visit (HOSPITAL_BASED_OUTPATIENT_CLINIC_OR_DEPARTMENT_OTHER): Payer: Self-pay

## 2021-01-08 ENCOUNTER — Encounter: Payer: Self-pay | Admitting: Family

## 2021-01-10 ENCOUNTER — Other Ambulatory Visit (HOSPITAL_BASED_OUTPATIENT_CLINIC_OR_DEPARTMENT_OTHER): Payer: Self-pay

## 2021-01-10 ENCOUNTER — Other Ambulatory Visit: Payer: Self-pay

## 2021-01-10 ENCOUNTER — Ambulatory Visit: Payer: BC Managed Care – PPO | Admitting: Family

## 2021-01-10 VITALS — BP 130/80 | HR 94 | Temp 98.6°F | Ht 66.0 in | Wt 327.8 lb

## 2021-01-10 DIAGNOSIS — R600 Localized edema: Secondary | ICD-10-CM | POA: Diagnosis not present

## 2021-01-10 MED ORDER — HYDROCHLOROTHIAZIDE 12.5 MG PO CAPS
ORAL_CAPSULE | ORAL | 0 refills | Status: DC
  Filled 2021-01-10: qty 30, 15d supply, fill #0

## 2021-01-10 NOTE — Progress Notes (Signed)
Diana Perez is a 45 y.o. female with the following history as recorded in EpicCare:  Patient Active Problem List   Diagnosis Date Noted   Irregular menses 10/07/2017   Hirsutism 10/07/2017   Weight gain 10/07/2017   Insomnia 10/07/2017   Pain of both hip joints 08/03/2017    Current Outpatient Medications  Medication Sig Dispense Refill   hydrochlorothiazide (MICROZIDE) 12.5 MG capsule Take 1-2 capsules by mouth as needed for swelling 30 capsule 0   traZODone (DESYREL) 100 MG tablet TAKE 1 TABLET (100 MG TOTAL) BY MOUTH AT BEDTIME AS NEEDED FOR SLEEP. 30 tablet 3   No current facility-administered medications for this visit.    Allergies: Patient has no known allergies.  Past Medical History:  Diagnosis Date   Eating disorder    History of chicken pox    Mononucleosis 06/2007    Past Surgical History:  Procedure Laterality Date   CESAREAN SECTION  05/08/2008   CESAREAN SECTION  02/04/2006    Family History  Problem Relation Age of Onset   Diabetes Mother    CAD Father        stents   Osteoarthritis Father        spinal stenosis   Thyroid cancer Maternal Grandfather        "multiple types"   Sudden death Maternal Grandfather        "suicide"   Heart disease Paternal Grandfather        Early 34's   Obesity Sister    Cancer Maternal Aunt    Breast cancer Maternal Aunt 8    Social History   Tobacco Use   Smoking status: Never   Smokeless tobacco: Never  Substance Use Topics   Alcohol use: Yes    Alcohol/week: 5.0 - 6.0 standard drinks    Types: 2 - 3 Standard drinks or equivalent, 3 Glasses of wine per week    Subjective:   Patient presents with concerns for 2 day history of bilateral foot/ ankle swelling; symptoms seemed to start after twisting knee;  Notes symptoms have been improved the past few days; onset of symptoms did correlate while she was on vacation and eating out/ drinking alcohol; No pain or swelling in calf; no chest pain or shortness of  breath; no recent air travel; did travel by car to [place], [place] last week;    Objective:  Vitals:   01/10/21 0825  BP: 130/80  Pulse: 94  Temp: 98.6 F (37 C)  TempSrc: Oral  SpO2: 99%  Weight: (!) 327 lb 12.8 oz (148.7 kg)  Height: 5\' 6"  (1.676 m)    General: Well developed, well nourished, in no acute distress  Skin : Warm and dry.  Head: Normocephalic and atraumatic  Eyes: Sclera and conjunctiva clear; pupils round and reactive to light; extraocular movements intact  Ears: External normal; canals clear; tympanic membranes normal  Oropharynx: Pink, supple. No suspicious lesions  Neck: Supple without thyromegaly, adenopathy  Lungs: Respirations unlabored; clear to auscultation bilaterally without wheeze, rales, rhonchi  CVS exam: normal rate and regular rhythm.  Abdomen: Soft; nontender; nondistended; normoactive bowel sounds; no masses or hepatosplenomegaly  Musculoskeletal: No deformities; no active joint inflammation  Extremities: No edema, cyanosis, clubbing  Vessels: Symmetric bilaterally  Neurologic: Alert and oriented; speech intact; face symmetrical; moves all extremities well; CNII-XII intact without focal deficit   Assessment:  1. Pedal edema     Plan:  Symptoms appear to have resolved; suspect related to increased salt intake on recent  vacation; encouraged to exercise, elevate leg and drink water; Rx for HCTZ to use as needed for intermittent swelling; follow up worse, no better and will consider venous doppler;  This visit occurred during the SARS-CoV-2 public health emergency.  Safety protocols were in place, including screening questions prior to the visit, additional usage of staff PPE, and extensive cleaning of exam room while observing appropriate contact time as indicated for disinfecting solutions.     No follow-ups on file.  No orders of the defined types were placed in this encounter.   Requested Prescriptions   Signed Prescriptions Disp  Refills   hydrochlorothiazide (MICROZIDE) 12.5 MG capsule 30 capsule 0    Sig: Take 1-2 capsules by mouth as needed for swelling

## 2021-01-20 ENCOUNTER — Other Ambulatory Visit (HOSPITAL_BASED_OUTPATIENT_CLINIC_OR_DEPARTMENT_OTHER): Payer: Self-pay

## 2021-02-19 ENCOUNTER — Other Ambulatory Visit (HOSPITAL_BASED_OUTPATIENT_CLINIC_OR_DEPARTMENT_OTHER): Payer: Self-pay

## 2021-03-25 ENCOUNTER — Other Ambulatory Visit: Payer: Self-pay

## 2021-03-27 ENCOUNTER — Other Ambulatory Visit (HOSPITAL_BASED_OUTPATIENT_CLINIC_OR_DEPARTMENT_OTHER): Payer: Self-pay

## 2021-03-27 ENCOUNTER — Telehealth: Payer: Self-pay

## 2021-03-27 DIAGNOSIS — F5101 Primary insomnia: Secondary | ICD-10-CM

## 2021-03-27 MED ORDER — TRAZODONE HCL 100 MG PO TABS
ORAL_TABLET | Freq: Every evening | ORAL | 3 refills | Status: DC | PRN
  Filled 2021-03-27: qty 30, 30d supply, fill #0
  Filled 2021-04-24: qty 30, 30d supply, fill #1
  Filled 2021-05-26: qty 30, 30d supply, fill #2

## 2021-03-27 NOTE — Telephone Encounter (Signed)
refilled 

## 2021-03-27 NOTE — Telephone Encounter (Signed)
Patient called requesting refill on trazadone for sleep. Patient has annual exam schedule for Apr 28 2021 with Dr.Harraway Katrinka Blazing but will be out of trazadone before then.   Will route to provider for approval

## 2021-04-25 ENCOUNTER — Other Ambulatory Visit (HOSPITAL_BASED_OUTPATIENT_CLINIC_OR_DEPARTMENT_OTHER): Payer: Self-pay

## 2021-04-28 ENCOUNTER — Ambulatory Visit: Payer: BC Managed Care – PPO | Admitting: Obstetrics & Gynecology

## 2021-05-26 ENCOUNTER — Other Ambulatory Visit (HOSPITAL_BASED_OUTPATIENT_CLINIC_OR_DEPARTMENT_OTHER): Payer: Self-pay

## 2021-05-26 ENCOUNTER — Ambulatory Visit (INDEPENDENT_AMBULATORY_CARE_PROVIDER_SITE_OTHER): Payer: BC Managed Care – PPO | Admitting: Family Medicine

## 2021-05-26 ENCOUNTER — Other Ambulatory Visit: Payer: Self-pay

## 2021-05-26 ENCOUNTER — Encounter: Payer: Self-pay | Admitting: Family Medicine

## 2021-05-26 VITALS — BP 143/97 | HR 110 | Temp 98.7°F | Ht 66.0 in | Wt 329.1 lb

## 2021-05-26 DIAGNOSIS — Z23 Encounter for immunization: Secondary | ICD-10-CM

## 2021-05-26 DIAGNOSIS — F5101 Primary insomnia: Secondary | ICD-10-CM | POA: Diagnosis not present

## 2021-05-26 DIAGNOSIS — Z01419 Encounter for gynecological examination (general) (routine) without abnormal findings: Secondary | ICD-10-CM

## 2021-05-26 DIAGNOSIS — Z1231 Encounter for screening mammogram for malignant neoplasm of breast: Secondary | ICD-10-CM

## 2021-05-26 MED ORDER — TRAZODONE HCL 100 MG PO TABS
100.0000 mg | ORAL_TABLET | Freq: Every evening | ORAL | 3 refills | Status: DC | PRN
  Filled 2021-05-26: qty 30, 30d supply, fill #0
  Filled 2021-06-26: qty 30, 30d supply, fill #1
  Filled 2021-07-22: qty 30, 30d supply, fill #2
  Filled 2021-08-24: qty 30, 30d supply, fill #3

## 2021-05-26 NOTE — Progress Notes (Signed)
Subjective:     Diana Perez is a 45 y.o. female and is here for a comprehensive physical exam. The patient reports no problems. Pap done 02/2020 and WNL with negative HPV.  The following portions of the patient's history were reviewed and updated as appropriate: allergies, current medications, past family history, past medical history, past social history, past surgical history, and problem list.  Review of Systems Pertinent items noted in HPI and remainder of comprehensive ROS otherwise negative.   Objective:    BP (!) 143/97 (BP Location: Left Arm, Patient Position: Sitting, Cuff Size: Large)   Pulse (!) 110   Temp 98.7 F (37.1 C) (Oral)   Ht 5\' 6"  (1.676 m)   Wt (!) 329 lb 1.9 oz (149.3 kg)   LMP 05/08/2021 (Exact Date)   BMI 53.12 kg/m  Chaperone present for exam General appearance: alert, cooperative, appears stated age, and moderately obese Head: Normocephalic, without obvious abnormality, atraumatic Neck: no adenopathy, supple, symmetrical, trachea midline, and thyroid not enlarged, symmetric, no tenderness/mass/nodules Lungs: clear to auscultation bilaterally Breasts: normal appearance, no masses or tenderness Heart: regular rate and rhythm, S1, S2 normal, no murmur, click, rub or gallop Abdomen: soft, non-tender; bowel sounds normal; no masses,  no organomegaly Pelvic: cervix normal in appearance, external genitalia normal, no adnexal masses or tenderness, no cervical motion tenderness, uterus normal size, shape, and consistency, and vagina normal without discharge Extremities: extremities normal, atraumatic, no cyanosis or edema Pulses: 2+ and symmetric Skin: Skin color, texture, turgor normal. No rashes or lesions Lymph nodes: Cervical, supraclavicular, and axillary nodes normal. Neurologic: Grossly normal    Assessment:    Healthy female exam.      Plan:  Encounter for gynecological examination without abnormal finding - Plan: Ambulatory referral to  Gastroenterology, Tdap vaccine greater than or equal to 7yo IM, CANCELED: MM DIAG BREAST TOMO BILATERAL  Primary insomnia - refilled her Trazadone - Plan: traZODone (DESYREL) 100 MG tablet  Screening mammogram for breast cancer - Plan: MM Digital Screening  Return in 1 year (on 05/26/2022).    See After Visit Summary for Counseling Recommendations

## 2021-05-26 NOTE — Patient Instructions (Signed)
Preventive Care 21-45 Years Old, Female Preventive care refers to lifestyle choices and visits with your health care provider that can promote health and wellness. This includes: A yearly physical exam. This is also called an annual wellness visit. Regular dental and eye exams. Immunizations. Screening for certain conditions. Healthy lifestyle choices, such as: Eating a healthy diet. Getting regular exercise. Not using drugs or products that contain nicotine and tobacco. Limiting alcohol use. What can I expect for my preventive care visit? Physical exam Your health care provider may check your: Height and weight. These may be used to calculate your BMI (body mass index). BMI is a measurement that tells if you are at a healthy weight. Heart rate and blood pressure. Body temperature. Skin for abnormal spots. Counseling Your health care provider may ask you questions about your: Past medical problems. Family's medical history. Alcohol, tobacco, and drug use. Emotional well-being. Home life and relationship well-being. Sexual activity. Diet, exercise, and sleep habits. Work and work environment. Access to firearms. Method of birth control. Menstrual cycle. Pregnancy history. What immunizations do I need? Vaccines are usually given at various ages, according to a schedule. Your health care provider will recommend vaccines for you based on your age, medical history, and lifestyle or other factors, such as travel or where you work. What tests do I need? Blood tests Lipid and cholesterol levels. These may be checked every 5 years starting at age 20. Hepatitis C test. Hepatitis B test. Screening Diabetes screening. This is done by checking your blood sugar (glucose) after you have not eaten for a while (fasting). STD (sexually transmitted disease) testing, if you are at risk. BRCA-related cancer screening. This may be done if you have a family history of breast, ovarian, tubal, or  peritoneal cancers. Pelvic exam and Pap test. This may be done every 3 years starting at age 21. Starting at age 30, this may be done every 5 years if you have a Pap test in combination with an HPV test. Talk with your health care provider about your test results, treatment options, and if necessary, the need for more tests. Follow these instructions at home: Eating and drinking  Eat a healthy diet that includes fresh fruits and vegetables, whole grains, lean protein, and low-fat dairy products. Take vitamin and mineral supplements as recommended by your health care provider. Do not drink alcohol if: Your health care provider tells you not to drink. You are pregnant, may be pregnant, or are planning to become pregnant. If you drink alcohol: Limit how much you have to 0-1 drink a day. Be aware of how much alcohol is in your drink. In the [place] one drink equals one 12 oz bottle of beer (355 mL), one 5 oz glass of wine (148 mL), or one 1 oz glass of hard liquor (44 mL). Lifestyle Take daily care of your teeth and gums. Brush your teeth every morning and night with fluoride toothpaste. Floss one time each day. Stay active. Exercise for at least 30 minutes 5 or more days each week. Do not use any products that contain nicotine or tobacco, such as cigarettes, e-cigarettes, and chewing tobacco. If you need help quitting, ask your health care provider. Do not use drugs. If you are sexually active, practice safe sex. Use a condom or other form of protection to prevent STIs (sexually transmitted infections). If you do not wish to become pregnant, use a form of birth control. If you plan to become pregnant, see your health care provider   for a prepregnancy visit. Find healthy ways to cope with stress, such as: Meditation, yoga, or listening to music. Journaling. Talking to a trusted person. Spending time with friends and family. Safety Always wear your seat belt while driving or riding in a  vehicle. Do not drive: If you have been drinking alcohol. Do not ride with someone who has been drinking. When you are tired or distracted. While texting. Wear a helmet and other protective equipment during sports activities. If you have firearms in your house, make sure you follow all gun safety procedures. Seek help if you have been physically or sexually abused. What's next? Go to your health care provider once a year for an annual wellness visit. Ask your health care provider how often you should have your eyes and teeth checked. Stay up to date on all vaccines. This information is not intended to replace advice given to you by your health care provider. Make sure you discuss any questions you have with your health care provider. Document Revised: 09/20/2020 Document Reviewed: 03/24/2018 Elsevier Patient Education  2022 [employer].  

## 2021-05-29 ENCOUNTER — Encounter: Payer: Self-pay | Admitting: Gastroenterology

## 2021-06-26 ENCOUNTER — Other Ambulatory Visit (HOSPITAL_BASED_OUTPATIENT_CLINIC_OR_DEPARTMENT_OTHER): Payer: Self-pay

## 2021-07-03 ENCOUNTER — Telehealth: Payer: Self-pay | Admitting: *Deleted

## 2021-07-03 NOTE — Telephone Encounter (Signed)
Dr.Nandigam,  This healthy patient has been scheduled for a direct screening colonoscopy. October 2022 BMI was 53. Ok for direct colonoscopy at hospital or OV? Please advise. Thank you, Kaitlynd Phillips PV

## 2021-07-07 NOTE — Telephone Encounter (Signed)
Please schedule direct screening colonoscopy at hospital endo with next available provider. Thanks

## 2021-07-08 ENCOUNTER — Other Ambulatory Visit: Payer: Self-pay

## 2021-07-08 ENCOUNTER — Ambulatory Visit (HOSPITAL_BASED_OUTPATIENT_CLINIC_OR_DEPARTMENT_OTHER)
Admission: RE | Admit: 2021-07-08 | Discharge: 2021-07-08 | Disposition: A | Discharge status: Home / Self Care | Payer: BC Managed Care – PPO | Source: Ambulatory Visit | Attending: Family Medicine | Admitting: Family Medicine

## 2021-07-08 ENCOUNTER — Encounter (HOSPITAL_BASED_OUTPATIENT_CLINIC_OR_DEPARTMENT_OTHER): Payer: Self-pay

## 2021-07-08 DIAGNOSIS — Z1231 Encounter for screening mammogram for malignant neoplasm of breast: Secondary | ICD-10-CM | POA: Insufficient documentation

## 2021-07-09 ENCOUNTER — Other Ambulatory Visit: Payer: Self-pay

## 2021-07-09 DIAGNOSIS — Z1211 Encounter for screening for malignant neoplasm of colon: Secondary | ICD-10-CM

## 2021-07-09 NOTE — Telephone Encounter (Signed)
Called the patient. No answer. Left a message asking her to call me back to discuss.  I moved her to the [facility] Endoscopy unit for 08/04/21 at 10:30 am.

## 2021-07-10 NOTE — Telephone Encounter (Signed)
Patient notified of the changed location for her procedure.

## 2021-07-22 ENCOUNTER — Other Ambulatory Visit: Payer: Self-pay

## 2021-07-22 ENCOUNTER — Other Ambulatory Visit (HOSPITAL_BASED_OUTPATIENT_CLINIC_OR_DEPARTMENT_OTHER): Payer: Self-pay

## 2021-07-22 ENCOUNTER — Ambulatory Visit (AMBULATORY_SURGERY_CENTER): Payer: BC Managed Care – PPO | Admitting: *Deleted

## 2021-07-22 VITALS — Ht 66.0 in | Wt 320.0 lb

## 2021-07-22 DIAGNOSIS — Z1211 Encounter for screening for malignant neoplasm of colon: Secondary | ICD-10-CM

## 2021-07-22 MED ORDER — NA SULFATE-K SULFATE-MG SULF 17.5-3.13-1.6 GM/177ML PO SOLN
1.0000 | Freq: Once | ORAL | 0 refills | Status: AC
  Filled 2021-07-22: qty 354, 1d supply, fill #0

## 2021-07-22 NOTE — Progress Notes (Signed)
Pt's previsit is done over the phone and all paperwork (prep instructions, blank consent form to just read over) sent to patient.  Pt's name and DOB verified at the beginning of the previsit.    No trouble with anesthesia, denies being told they were difficult to intubate, or hx/fam hx of malignant hyperthermia per pt   No egg or soy allergy  No home oxygen use   No medications for weight loss taken  emmi information given  Pt denies constipation issues  Pt informed that we do not do prior authorizations for prep

## 2021-07-23 ENCOUNTER — Encounter (HOSPITAL_COMMUNITY): Payer: Self-pay | Admitting: Internal Medicine

## 2021-07-25 ENCOUNTER — Telehealth: Payer: Self-pay | Admitting: Internal Medicine

## 2021-07-25 NOTE — Telephone Encounter (Signed)
Pt states she wants to cancel her Colonoscopy at [facility] On 08/04/2021 with Dr. Leone Payor. Pt states that she does not want to provide a reason why she is canceling. Pt stated that she will call in at a later date and reschedule.   Scheduling Dept at [facility] notified and Procedure Canceled.

## 2021-07-25 NOTE — Telephone Encounter (Signed)
Patient called and wanted to cancel her hospital procedure with Dr. Leone Payor for 08/04/21.  She does not want to reschedule at this time.  Thank you.

## 2021-07-30 ENCOUNTER — Encounter: Payer: Self-pay | Admitting: Internal Medicine

## 2021-08-04 ENCOUNTER — Encounter: Payer: BC Managed Care – PPO | Admitting: Gastroenterology

## 2021-08-04 ENCOUNTER — Ambulatory Visit (HOSPITAL_COMMUNITY)
Admission: RE | Admit: 2021-08-04 | Payer: BC Managed Care – PPO | Source: Home / Self Care | Admitting: Internal Medicine

## 2021-08-04 SURGERY — COLONOSCOPY WITH PROPOFOL
Anesthesia: Monitor Anesthesia Care

## 2021-08-25 ENCOUNTER — Other Ambulatory Visit (HOSPITAL_BASED_OUTPATIENT_CLINIC_OR_DEPARTMENT_OTHER): Payer: Self-pay

## 2021-09-22 ENCOUNTER — Other Ambulatory Visit: Payer: Self-pay | Admitting: Family Medicine

## 2021-09-22 DIAGNOSIS — F5101 Primary insomnia: Secondary | ICD-10-CM

## 2021-09-23 ENCOUNTER — Other Ambulatory Visit (HOSPITAL_BASED_OUTPATIENT_CLINIC_OR_DEPARTMENT_OTHER): Payer: Self-pay

## 2021-09-23 MED ORDER — TRAZODONE HCL 100 MG PO TABS
100.0000 mg | ORAL_TABLET | Freq: Every evening | ORAL | 3 refills | Status: DC | PRN
  Filled 2021-09-23: qty 30, 30d supply, fill #0
  Filled 2021-10-27: qty 30, 30d supply, fill #1
  Filled 2021-11-25: qty 30, 30d supply, fill #2
  Filled 2021-12-25: qty 30, 30d supply, fill #3

## 2021-10-27 ENCOUNTER — Other Ambulatory Visit (HOSPITAL_BASED_OUTPATIENT_CLINIC_OR_DEPARTMENT_OTHER): Payer: Self-pay

## 2021-11-26 ENCOUNTER — Other Ambulatory Visit (HOSPITAL_BASED_OUTPATIENT_CLINIC_OR_DEPARTMENT_OTHER): Payer: Self-pay

## 2021-12-26 ENCOUNTER — Other Ambulatory Visit (HOSPITAL_BASED_OUTPATIENT_CLINIC_OR_DEPARTMENT_OTHER): Payer: Self-pay

## 2022-01-29 ENCOUNTER — Other Ambulatory Visit: Payer: Self-pay | Admitting: Family Medicine

## 2022-01-29 ENCOUNTER — Other Ambulatory Visit (HOSPITAL_BASED_OUTPATIENT_CLINIC_OR_DEPARTMENT_OTHER): Payer: Self-pay

## 2022-01-29 DIAGNOSIS — F5101 Primary insomnia: Secondary | ICD-10-CM

## 2022-02-02 ENCOUNTER — Other Ambulatory Visit (HOSPITAL_BASED_OUTPATIENT_CLINIC_OR_DEPARTMENT_OTHER): Payer: Self-pay

## 2022-02-09 ENCOUNTER — Other Ambulatory Visit: Payer: Self-pay | Admitting: Family Medicine

## 2022-02-09 ENCOUNTER — Other Ambulatory Visit (HOSPITAL_BASED_OUTPATIENT_CLINIC_OR_DEPARTMENT_OTHER): Payer: Self-pay

## 2022-02-09 DIAGNOSIS — F5101 Primary insomnia: Secondary | ICD-10-CM

## 2022-02-12 ENCOUNTER — Other Ambulatory Visit (HOSPITAL_BASED_OUTPATIENT_CLINIC_OR_DEPARTMENT_OTHER): Payer: Self-pay

## 2022-04-08 ENCOUNTER — Encounter: Payer: Self-pay | Admitting: General Practice

## 2022-05-13 IMAGING — MG MM DIGITAL SCREENING BILAT W/ TOMO AND CAD
6 of 10 series · 6 of 30 positions shown · non-contrast
Comparison: Previous exam(s).

CLINICAL DATA: Screening.

EXAM:
DIGITAL SCREENING BILATERAL MAMMOGRAM WITH TOMOSYNTHESIS AND CAD
TECHNIQUE: Bilateral screening digital craniocaudal and mediolateral oblique
mammograms were obtained. Bilateral screening digital breast
tomosynthesis was performed. The images were evaluated with
computer-aided detection.

[L CC synth-2D]
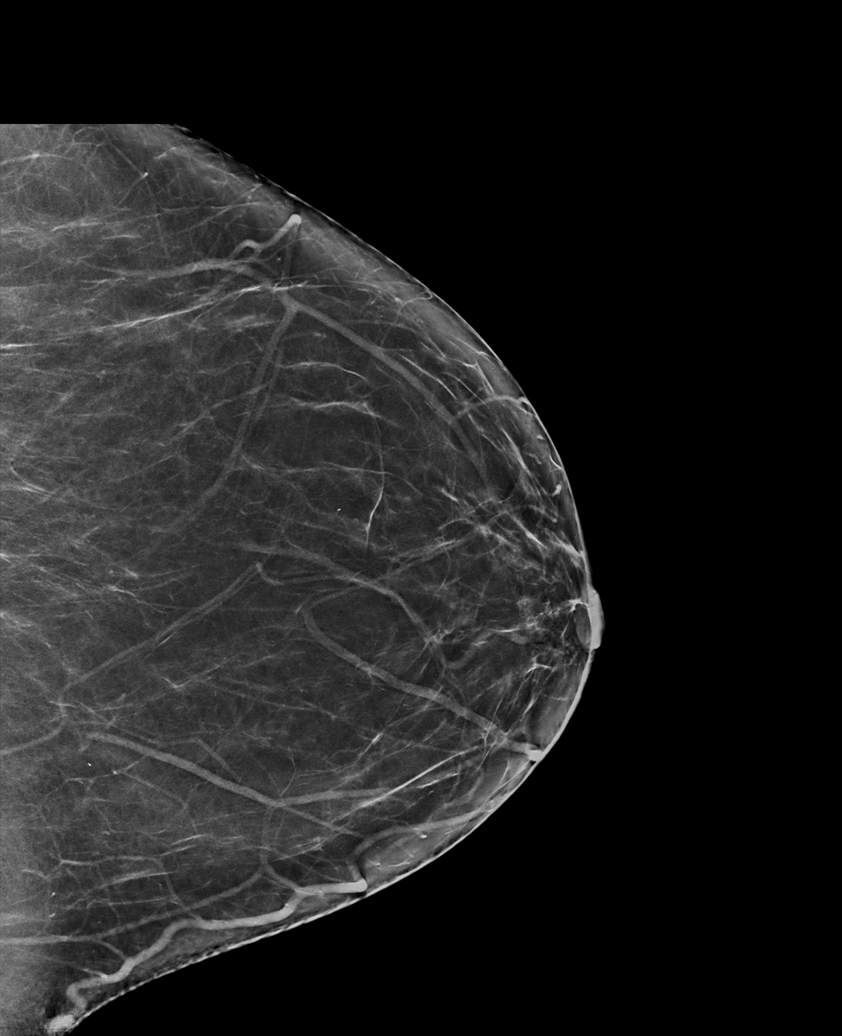

[R MLO synth-2D (1 of 2)]
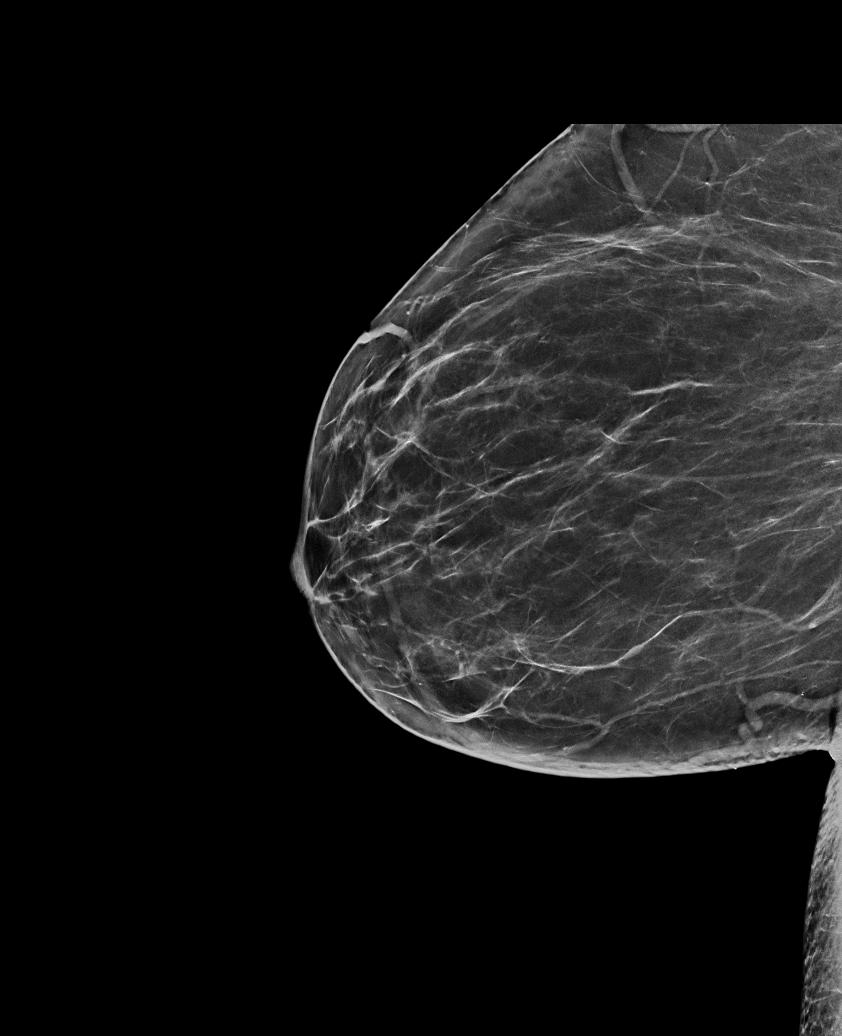

[R MLO synth-2D (2 of 2)]
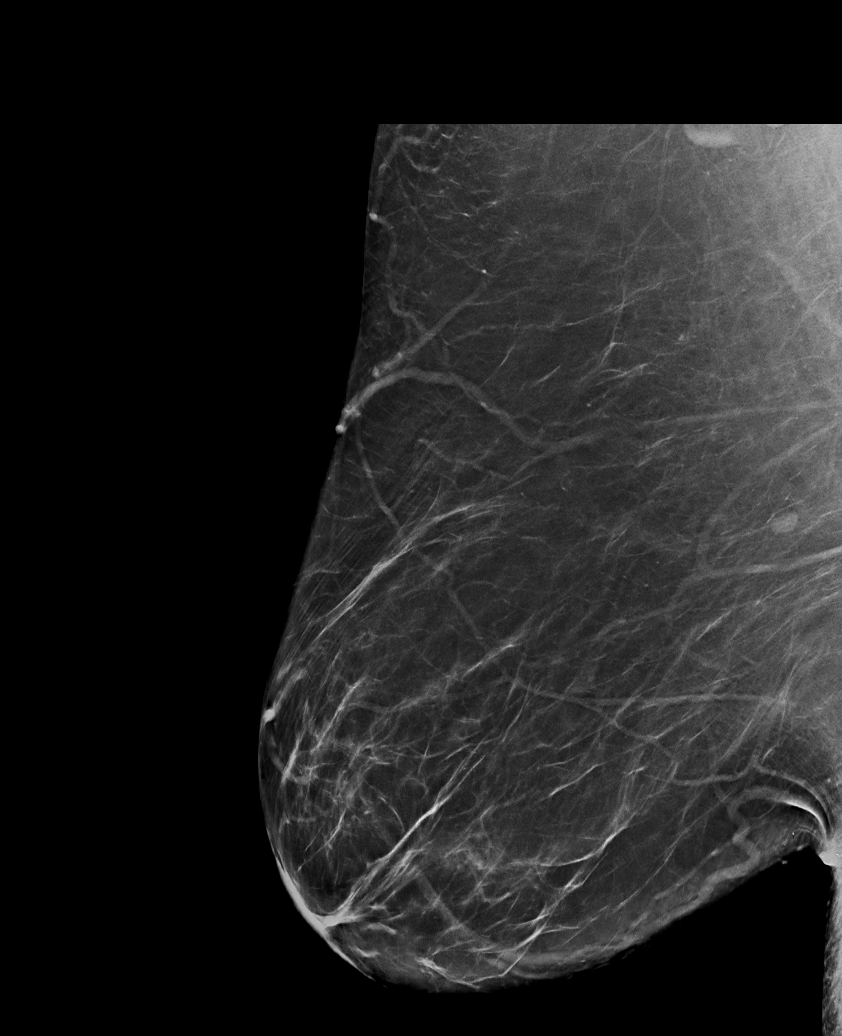

[R CC synth-2D]
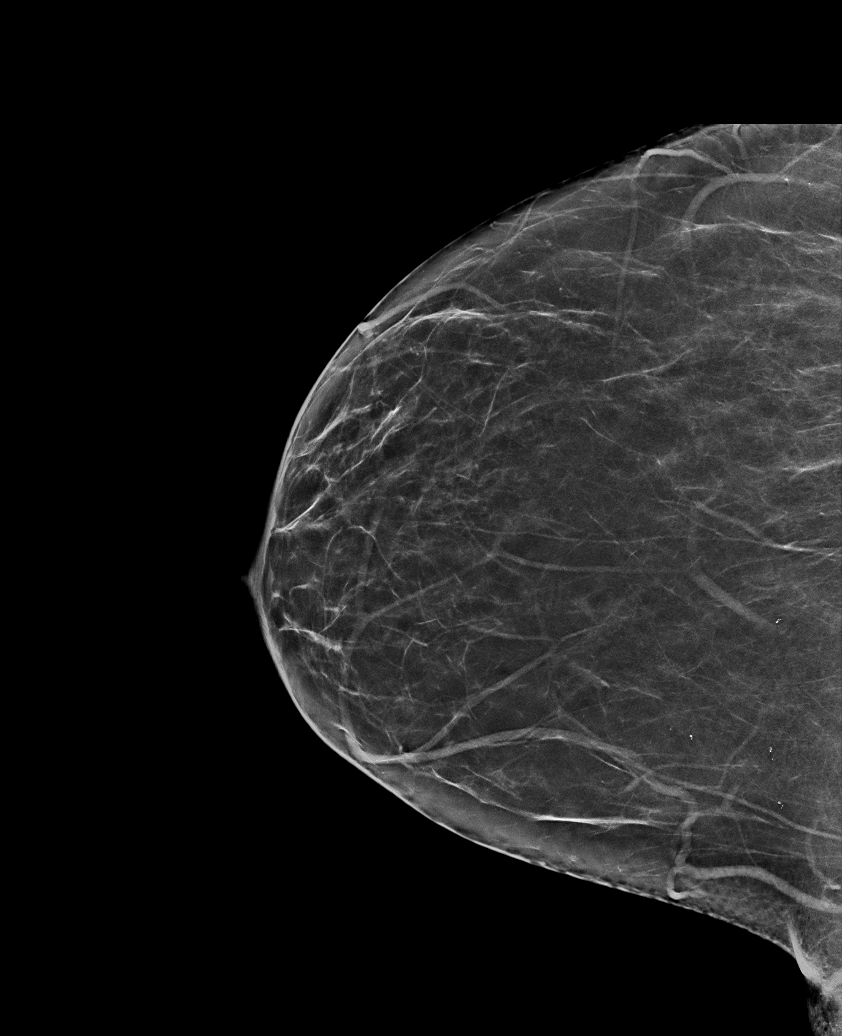

[L MLO synth-2D]
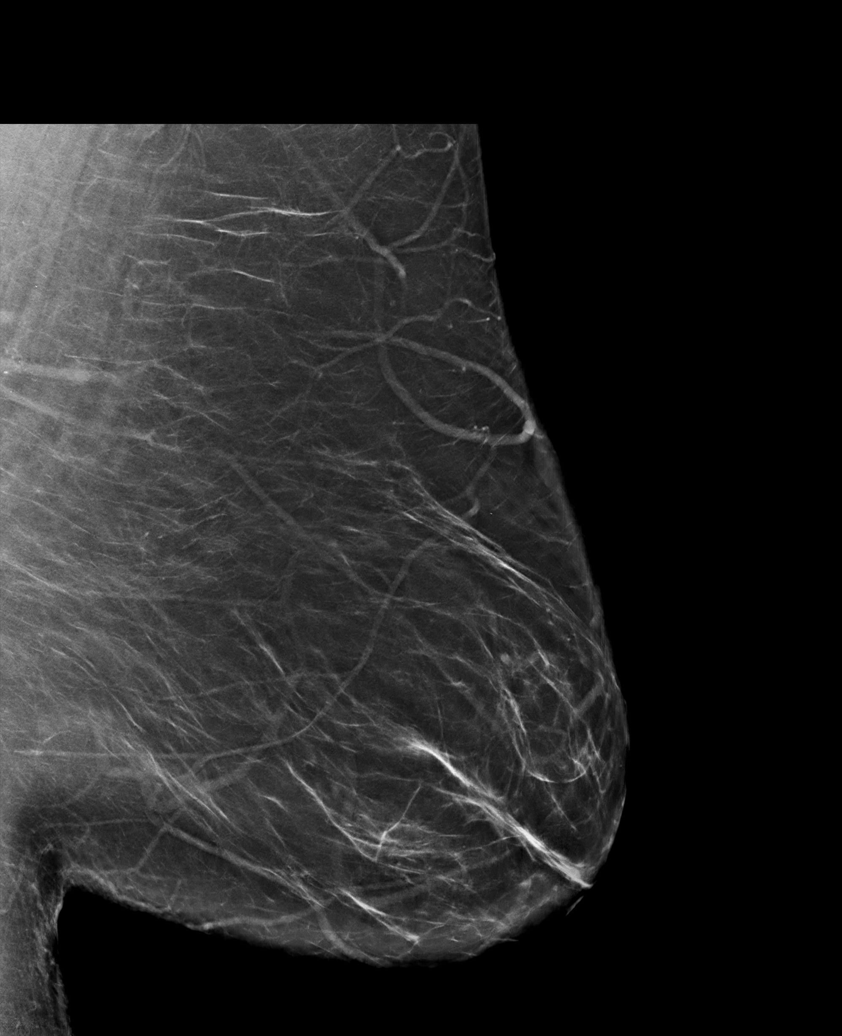

[R MLO tomo · tomo slice 38/75.0]
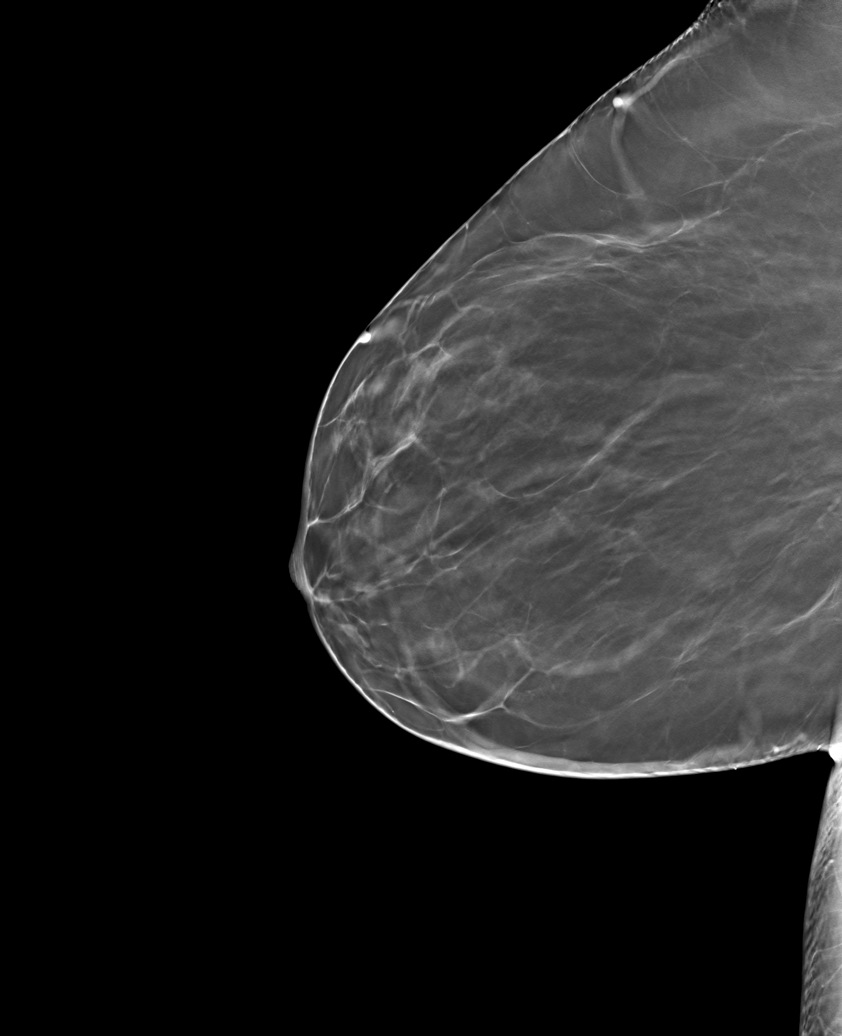

[6 of 30 positions shown; findings below may reference images not displayed]

ACR Breast Density Category b: There are scattered areas of
fibroglandular density.
FINDINGS: There are no findings suspicious for malignancy.
IMPRESSION: No mammographic evidence of malignancy. A result letter of this
screening mammogram will be mailed directly to the patient.

RECOMMENDATION:
Screening mammogram in one year. (Code:51-O-LD2)

BI-RADS CATEGORY  1: Negative.

## 2022-08-24 ENCOUNTER — Other Ambulatory Visit: Payer: Self-pay | Admitting: Family Medicine

## 2022-08-24 ENCOUNTER — Other Ambulatory Visit (HOSPITAL_BASED_OUTPATIENT_CLINIC_OR_DEPARTMENT_OTHER): Payer: Self-pay

## 2022-08-24 DIAGNOSIS — F5101 Primary insomnia: Secondary | ICD-10-CM

## 2022-08-28 ENCOUNTER — Other Ambulatory Visit: Payer: Self-pay | Admitting: Family Medicine

## 2022-08-28 ENCOUNTER — Other Ambulatory Visit (HOSPITAL_BASED_OUTPATIENT_CLINIC_OR_DEPARTMENT_OTHER): Payer: Self-pay

## 2022-08-28 DIAGNOSIS — F5101 Primary insomnia: Secondary | ICD-10-CM

## 2022-09-02 ENCOUNTER — Other Ambulatory Visit: Payer: Self-pay | Admitting: Family Medicine

## 2022-09-02 ENCOUNTER — Other Ambulatory Visit (HOSPITAL_BASED_OUTPATIENT_CLINIC_OR_DEPARTMENT_OTHER): Payer: Self-pay

## 2022-09-02 DIAGNOSIS — F5101 Primary insomnia: Secondary | ICD-10-CM

## 2022-09-18 ENCOUNTER — Other Ambulatory Visit (HOSPITAL_BASED_OUTPATIENT_CLINIC_OR_DEPARTMENT_OTHER): Payer: Self-pay

## 2023-02-24 ENCOUNTER — Telehealth: Payer: Self-pay | Admitting: Family

## 2023-02-24 NOTE — Telephone Encounter (Signed)
Pt states she would like to switch to [employer]. States she saw her once and would like to continue seeing her. Please advise.

## 2023-02-24 NOTE — Telephone Encounter (Signed)
OK with me.

## 2023-03-02 NOTE — Telephone Encounter (Signed)
Ldvm

## 2023-03-11 ENCOUNTER — Encounter: Payer: Self-pay | Admitting: Family

## 2023-03-11 ENCOUNTER — Ambulatory Visit (HOSPITAL_BASED_OUTPATIENT_CLINIC_OR_DEPARTMENT_OTHER)
Admission: RE | Admit: 2023-03-11 | Discharge: 2023-03-11 | Disposition: A | Discharge status: Home / Self Care | Payer: BC Managed Care – PPO | Source: Ambulatory Visit | Attending: Family | Admitting: Family

## 2023-03-11 ENCOUNTER — Ambulatory Visit: Payer: BC Managed Care – PPO | Admitting: Family

## 2023-03-11 VITALS — BP 132/86 | HR 100 | Temp 98.1°F | Resp 18 | Ht 66.0 in | Wt 276.6 lb

## 2023-03-11 DIAGNOSIS — Z1231 Encounter for screening mammogram for malignant neoplasm of breast: Secondary | ICD-10-CM

## 2023-03-11 DIAGNOSIS — R7309 Other abnormal glucose: Secondary | ICD-10-CM | POA: Diagnosis not present

## 2023-03-11 DIAGNOSIS — Z1211 Encounter for screening for malignant neoplasm of colon: Secondary | ICD-10-CM

## 2023-03-11 DIAGNOSIS — Z0000 Encounter for general adult medical examination without abnormal findings: Secondary | ICD-10-CM | POA: Diagnosis not present

## 2023-03-11 DIAGNOSIS — Z13220 Encounter for screening for lipoid disorders: Secondary | ICD-10-CM

## 2023-03-11 DIAGNOSIS — L989 Disorder of the skin and subcutaneous tissue, unspecified: Secondary | ICD-10-CM

## 2023-03-11 DIAGNOSIS — D8940 Mast cell activation, unspecified: Secondary | ICD-10-CM

## 2023-03-11 NOTE — Progress Notes (Signed)
Diana Perez is a 47 y.o. 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     No current facility-administered medications for this visit.    Allergies: Patient has no known allergies.  Past Medical History:  Diagnosis Date   Allergy    Anxiety    "a long time ago" per pt   Eating disorder    History of chicken pox    Mononucleosis    in high school    Past Surgical History:  Procedure Laterality Date   CESAREAN SECTION  05/08/2008   CESAREAN SECTION  02/04/2006    Family History  Problem Relation Age of Onset   Diabetes Mother    CAD Father        stents   Osteoarthritis Father        spinal stenosis   Obesity Sister    Fibrocystic breast disease Sister    Cancer Maternal Aunt    Breast cancer Maternal Aunt 69   Thyroid cancer Maternal Grandfather        "multiple types"   Sudden death Maternal Grandfather        "suicide"   Heart disease Paternal Grandfather        Early 10's   Colon cancer Neg Hx    Esophageal cancer Neg Hx    Rectal cancer Neg Hx    Stomach cancer Neg Hx     Social History   Tobacco Use   Smoking status: Never   Smokeless tobacco: Never  Substance Use Topics   Alcohol use: Yes    Alcohol/week: 5.0 - 6.0 standard drinks of alcohol    Types: 2 - 3 Standard drinks or equivalent, 3 Glasses of wine per week    Subjective:   Presents today to transfer care; has not been seen here in over 2 years;  Has been working on lifestyle changes in the past year- down 45+ pounds;  Does not want to get Pap smear updated today-   Review of Systems  Constitutional:   Positive for weight loss.       Planned weight loss  HENT: Negative.    Eyes: Negative.   Respiratory: Negative.    Cardiovascular: Negative.   Gastrointestinal: Negative.   Genitourinary: Negative.   Musculoskeletal: Negative.   Skin: Negative.   Neurological: Negative.   Endo/Heme/Allergies: Negative.   Psychiatric/Behavioral: Negative.       Objective:  Vitals:   03/11/23 1455  BP: 132/86  Pulse: 100  Resp: 18  Temp: 98.1 F (36.7 C)  TempSrc: Oral  SpO2: 98%  Weight: 276 lb 9.6 oz (125.5 kg)  Height: 5\' 6"  (1.676 m)    General: Well developed, well nourished, in no acute distress  Skin : Warm and dry.  Head: Normocephalic and atraumatic  Eyes: Sclera and conjunctiva clear; pupils round and reactive to light; extraocular movements intact  Ears: External normal; canals clear; tympanic membranes normal  Oropharynx: Pink, supple. No suspicious lesions  Neck: Supple without thyromegaly, adenopathy  Lungs: Respirations unlabored; clear to auscultation bilaterally without wheeze, rales, rhonchi  CVS exam: normal rate  and regular rhythm.  Abdomen: Soft; nontender; nondistended; normoactive bowel sounds; no masses or hepatosplenomegaly  Musculoskeletal: No deformities; no active joint inflammation  Extremities: No edema, cyanosis, clubbing  Vessels: Symmetric bilaterally  Neurologic: Alert and oriented; speech intact; face symmetrical; moves all extremities well; CNII-XII intact without focal deficit   Assessment:  1. PE (physical exam), annual   2. Encounter for screening colonoscopy   3. Lipid screening   4. Elevated glucose   5. Visit for screening mammogram   6. Mast cell activation syndrome [facility])     Plan:  Congratulated patient on commitment to her health; Age appropriate preventive healthcare needs addressed; encouraged regular eye doctor and dental exams; encouraged regular exercise; will update labs and refills as needed today; follow-up to be determined based  on labs; Order for colonoscopy updated; order updated for mammogram; will need pap smear at later date;    No follow-ups on file.  Orders Placed This Encounter  Procedures   MM Digital Screening    Standing Status:   Future    Standing Expiration Date:   03/10/2024    Order Specific Question:   Reason for Exam (SYMPTOM  OR DIAGNOSIS REQUIRED)    Answer:   screening mammogram    Order Specific Question:   Is the patient pregnant?    Answer:   No    Order Specific Question:   Preferred imaging location?    Answer:   [facility]   CBC with Differential/Platelet   Comp Met (CMET)   Lipid panel   Hemoglobin A1c   TSH   Ambulatory referral to Gastroenterology    Referral Priority:   Routine    Referral Type:   Consultation    Referral Reason:   Specialty Services Required    Number of Visits Requested:   1    Requested Prescriptions    No prescriptions requested or ordered in this encounter

## 2023-03-12 LAB — COMPREHENSIVE METABOLIC PANEL
ALT: 11 U/L (ref 0–35)
AST: 13 U/L (ref 0–37)
Albumin: 4.4 g/dL (ref 3.5–5.2)
Alkaline Phosphatase: 63 U/L (ref 39–117)
BUN: 11 mg/dL (ref 6–23)
CO2: 24 meq/L (ref 19–32)
Calcium: 9.5 mg/dL (ref 8.4–10.5)
Chloride: 99 meq/L (ref 96–112)
Creatinine, Ser: 0.74 mg/dL (ref 0.40–1.20)
GFR: 96.77 mL/min (ref >60.00)
Glucose, Bld: 70 mg/dL (ref 70–99)
Potassium: 4.1 meq/L (ref 3.5–5.1)
Sodium: 133 meq/L — ABNORMAL LOW (ref 135–145)
Total Bilirubin: 0.6 mg/dL (ref 0.2–1.2)
Total Protein: 6.5 g/dL (ref 6.0–8.3)

## 2023-03-12 LAB — LIPID PANEL
Cholesterol: 188 mg/dL (ref 0–200)
HDL: 37.8 mg/dL — ABNORMAL LOW (ref >39.00)
LDL Cholesterol: 130 mg/dL — ABNORMAL HIGH (ref 0–99)
NonHDL: 150.27
Total CHOL/HDL Ratio: 5
Triglycerides: 99 mg/dL (ref 0.0–149.0)
VLDL: 19.8 mg/dL (ref 0.0–40.0)

## 2023-03-12 LAB — CBC WITH DIFFERENTIAL/PLATELET
Basophils Absolute: 0 10*3/uL (ref 0.0–0.1)
Basophils Relative: 0.6 % (ref 0.0–3.0)
Eosinophils Absolute: 0.1 10*3/uL (ref 0.0–0.7)
Eosinophils Relative: 2.2 % (ref 0.0–5.0)
HCT: 44.1 % (ref 36.0–46.0)
Hemoglobin: 14.2 g/dL (ref 12.0–15.0)
Lymphocytes Relative: 26 % (ref 12.0–46.0)
Lymphs Abs: 1.7 10*3/uL (ref 0.7–4.0)
MCHC: 32.2 g/dL (ref 30.0–36.0)
MCV: 90.5 fl (ref 78.0–100.0)
Monocytes Absolute: 0.5 10*3/uL (ref 0.1–1.0)
Monocytes Relative: 7.5 % (ref 3.0–12.0)
Neutro Abs: 4.3 10*3/uL (ref 1.4–7.7)
Neutrophils Relative %: 63.7 % (ref 43.0–77.0)
Platelets: 226 10*3/uL (ref 150.0–400.0)
RBC: 4.88 Mil/uL (ref 3.87–5.11)
RDW: 13.5 % (ref 11.5–15.5)
WBC: 6.7 10*3/uL (ref 4.0–10.5)

## 2023-03-12 LAB — HEMOGLOBIN A1C: Hgb A1c MFr Bld: 5.1 % (ref 4.6–6.5)

## 2023-03-12 LAB — TSH: TSH: 1.44 u[IU]/mL (ref 0.35–5.50)

## 2023-03-15 ENCOUNTER — Ambulatory Visit (HOSPITAL_BASED_OUTPATIENT_CLINIC_OR_DEPARTMENT_OTHER): Payer: BC Managed Care – PPO

## 2023-03-26 ENCOUNTER — Other Ambulatory Visit: Payer: Self-pay | Admitting: Medical Genetics

## 2023-03-26 DIAGNOSIS — Z006 Encounter for examination for normal comparison and control in clinical research program: Secondary | ICD-10-CM

## 2023-03-30 ENCOUNTER — Other Ambulatory Visit (HOSPITAL_COMMUNITY): Payer: Self-pay

## 2023-06-10 ENCOUNTER — Other Ambulatory Visit (HOSPITAL_COMMUNITY)
Admission: RE | Admit: 2023-06-10 | Discharge: 2023-06-10 | Disposition: A | Discharge status: Home / Self Care | Payer: BC Managed Care – PPO | Source: Ambulatory Visit | Attending: Oncology | Admitting: Oncology

## 2023-06-10 DIAGNOSIS — Z006 Encounter for examination for normal comparison and control in clinical research program: Secondary | ICD-10-CM | POA: Insufficient documentation

## 2023-06-22 LAB — GENECONNECT MOLECULAR SCREEN: Genetic Analysis Overall Interpretation: NEGATIVE
